# Patient Record
Sex: Female | Born: 1975 | Race: White | Hispanic: No | Smoking: Current every day smoker
Health system: Southern US, Community
[De-identification: ages and names within clinical notes are randomized; demographics above are authoritative.]

## PROBLEM LIST (undated history)

## (undated) DIAGNOSIS — S82201A Unspecified fracture of shaft of right tibia, initial encounter for closed fracture: Secondary | ICD-10-CM

## (undated) DIAGNOSIS — Z973 Presence of spectacles and contact lenses: Secondary | ICD-10-CM

## (undated) DIAGNOSIS — T8484XA Pain due to internal orthopedic prosthetic devices, implants and grafts, initial encounter: Secondary | ICD-10-CM

## (undated) DIAGNOSIS — S82401A Unspecified fracture of shaft of right fibula, initial encounter for closed fracture: Secondary | ICD-10-CM

## (undated) DIAGNOSIS — I10 Essential (primary) hypertension: Secondary | ICD-10-CM

## (undated) DIAGNOSIS — F419 Anxiety disorder, unspecified: Secondary | ICD-10-CM

## (undated) DIAGNOSIS — D649 Anemia, unspecified: Secondary | ICD-10-CM

## (undated) HISTORY — PX: FRACTURE SURGERY: SHX138

---

## 1995-01-29 HISTORY — PX: DILATION AND CURETTAGE OF UTERUS: SHX78

## 2006-04-26 ENCOUNTER — Emergency Department: Payer: Self-pay | Admitting: General Practice

## 2006-07-01 ENCOUNTER — Emergency Department (HOSPITAL_COMMUNITY): Admission: EM | Admit: 2006-07-01 | Discharge: 2006-07-01 | Payer: Self-pay | Admitting: Emergency Medicine

## 2007-01-30 ENCOUNTER — Emergency Department (HOSPITAL_COMMUNITY): Admission: EM | Admit: 2007-01-30 | Discharge: 2007-01-30 | Payer: Self-pay | Admitting: Emergency Medicine

## 2007-03-09 ENCOUNTER — Emergency Department (HOSPITAL_COMMUNITY): Admission: EM | Admit: 2007-03-09 | Discharge: 2007-03-09 | Payer: Self-pay | Admitting: Emergency Medicine

## 2007-04-10 ENCOUNTER — Ambulatory Visit: Payer: Self-pay | Admitting: Oncology

## 2007-04-16 LAB — CBC WITH DIFFERENTIAL (CANCER CENTER ONLY)
BASO#: 0 10*3/uL (ref 0.0–0.2)
BASO%: 0.4 % (ref 0.0–2.0)
Eosinophils Absolute: 0.1 10*3/uL (ref 0.0–0.5)
HGB: 11.1 g/dL — ABNORMAL LOW (ref 11.6–15.9)
LYMPH#: 1.6 10*3/uL (ref 0.9–3.3)
MCV: 73 fL — ABNORMAL LOW (ref 81–101)
MONO%: 5.1 % (ref 0.0–13.0)
NEUT#: 2.8 10*3/uL (ref 1.5–6.5)
Platelets: 275 10*3/uL (ref 145–400)
RDW: 15.1 % — ABNORMAL HIGH (ref 10.5–14.6)

## 2007-04-16 LAB — MORPHOLOGY - CHCC SATELLITE: PLT EST ~~LOC~~: ADEQUATE

## 2007-04-16 LAB — COMPREHENSIVE METABOLIC PANEL
Albumin: 4.7 g/dL (ref 3.5–5.2)
Alkaline Phosphatase: 71 U/L (ref 39–117)
BUN: 17 mg/dL (ref 6–23)
CO2: 23 mEq/L (ref 19–32)
Calcium: 8.9 mg/dL (ref 8.4–10.5)
Creatinine, Ser: 1.01 mg/dL (ref 0.40–1.20)
Sodium: 138 mEq/L (ref 135–145)

## 2007-04-16 LAB — IRON AND TIBC
%SAT: 8 % — ABNORMAL LOW (ref 20–55)
Iron: 35 ug/dL — ABNORMAL LOW (ref 42–145)
TIBC: 461 ug/dL (ref 250–470)

## 2007-04-16 LAB — VITAMIN B12: Vitamin B-12: 358 pg/mL (ref 211–911)

## 2007-04-16 LAB — FOLATE: Folate: 8.4 ng/mL

## 2007-04-23 ENCOUNTER — Emergency Department (HOSPITAL_COMMUNITY): Admission: EM | Admit: 2007-04-23 | Discharge: 2007-04-24 | Payer: Self-pay | Admitting: Emergency Medicine

## 2007-05-26 ENCOUNTER — Ambulatory Visit: Payer: Self-pay | Admitting: Oncology

## 2007-06-23 ENCOUNTER — Ambulatory Visit: Payer: Self-pay | Admitting: Internal Medicine

## 2007-06-25 LAB — CBC WITH DIFFERENTIAL (CANCER CENTER ONLY)
BASO#: 0 10*3/uL (ref 0.0–0.2)
EOS%: 3.9 % (ref 0.0–7.0)
HCT: 39.2 % (ref 34.8–46.6)
MCH: 29.2 pg (ref 26.0–34.0)
MCV: 85 fL (ref 81–101)
MONO#: 0.3 10*3/uL (ref 0.1–0.9)
NEUT#: 3.1 10*3/uL (ref 1.5–6.5)
Platelets: 272 10*3/uL (ref 145–400)
RDW: 18.4 % — ABNORMAL HIGH (ref 10.5–14.6)

## 2007-06-25 LAB — IRON AND TIBC
Iron: 46 ug/dL (ref 42–145)
UIBC: 228 ug/dL

## 2007-07-28 ENCOUNTER — Emergency Department (HOSPITAL_COMMUNITY): Admission: EM | Admit: 2007-07-28 | Discharge: 2007-07-28 | Payer: Self-pay | Admitting: Emergency Medicine

## 2007-08-07 ENCOUNTER — Emergency Department (HOSPITAL_COMMUNITY): Admission: EM | Admit: 2007-08-07 | Discharge: 2007-08-07 | Payer: Self-pay | Admitting: Emergency Medicine

## 2007-09-18 ENCOUNTER — Ambulatory Visit: Payer: Self-pay | Admitting: Oncology

## 2007-09-21 LAB — CBC WITH DIFFERENTIAL (CANCER CENTER ONLY)
BASO#: 0 10*3/uL (ref 0.0–0.2)
EOS%: 2 % (ref 0.0–7.0)
Eosinophils Absolute: 0.1 10*3/uL (ref 0.0–0.5)
HCT: 38.2 % (ref 34.8–46.6)
HGB: 13 g/dL (ref 11.6–15.9)
MCV: 92 fL (ref 81–101)
MONO%: 6 % (ref 0.0–13.0)
NEUT#: 2.4 10*3/uL (ref 1.5–6.5)
NEUT%: 50.2 % (ref 39.6–80.0)
RBC: 4.17 10*6/uL (ref 3.70–5.32)
WBC: 4.7 10*3/uL (ref 3.9–10.0)

## 2007-09-21 LAB — IRON AND TIBC
Iron: 140 ug/dL (ref 42–145)
TIBC: 346 ug/dL (ref 250–470)

## 2007-11-19 ENCOUNTER — Emergency Department (HOSPITAL_COMMUNITY): Admission: EM | Admit: 2007-11-19 | Discharge: 2007-11-19 | Payer: Self-pay | Admitting: Emergency Medicine

## 2007-12-17 ENCOUNTER — Ambulatory Visit: Payer: Self-pay | Admitting: Oncology

## 2007-12-19 ENCOUNTER — Emergency Department (HOSPITAL_COMMUNITY): Admission: EM | Admit: 2007-12-19 | Discharge: 2007-12-19 | Payer: Self-pay | Admitting: Emergency Medicine

## 2007-12-22 LAB — CBC WITH DIFFERENTIAL (CANCER CENTER ONLY)
BASO%: 0.5 % (ref 0.0–2.0)
EOS%: 4 % (ref 0.0–7.0)
HCT: 39.7 % (ref 34.8–46.6)
LYMPH%: 28.8 % (ref 14.0–48.0)
MCH: 30.9 pg (ref 26.0–34.0)
MCHC: 34.1 g/dL (ref 32.0–36.0)
MCV: 91 fL (ref 81–101)
MONO%: 5.2 % (ref 0.0–13.0)
RDW: 10.6 % (ref 10.5–14.6)
WBC: 6.3 10*3/uL (ref 3.9–10.0)

## 2007-12-22 LAB — FERRITIN: Ferritin: 194 ng/mL (ref 10–291)

## 2007-12-22 LAB — IRON AND TIBC: %SAT: 30 % (ref 20–55)

## 2008-01-25 ENCOUNTER — Emergency Department (HOSPITAL_COMMUNITY): Admission: EM | Admit: 2008-01-25 | Discharge: 2008-01-25 | Payer: Self-pay | Admitting: Emergency Medicine

## 2008-03-11 ENCOUNTER — Emergency Department (HOSPITAL_COMMUNITY): Admission: EM | Admit: 2008-03-11 | Discharge: 2008-03-11 | Payer: Self-pay | Admitting: Emergency Medicine

## 2008-04-07 ENCOUNTER — Emergency Department (HOSPITAL_COMMUNITY): Admission: EM | Admit: 2008-04-07 | Discharge: 2008-04-07 | Payer: Self-pay | Admitting: Emergency Medicine

## 2008-04-08 ENCOUNTER — Emergency Department (HOSPITAL_COMMUNITY): Admission: EM | Admit: 2008-04-08 | Discharge: 2008-04-08 | Payer: Self-pay | Admitting: Emergency Medicine

## 2008-04-14 ENCOUNTER — Ambulatory Visit: Payer: Self-pay | Admitting: Oncology

## 2008-04-18 LAB — IRON AND TIBC
%SAT: 42 % (ref 20–55)
Iron: 134 ug/dL (ref 42–145)

## 2008-04-18 LAB — CBC WITH DIFFERENTIAL (CANCER CENTER ONLY)
EOS%: 2.9 % (ref 0.0–7.0)
Eosinophils Absolute: 0.1 10*3/uL (ref 0.0–0.5)
HCT: 38.1 % (ref 34.8–46.6)
LYMPH%: 28.4 % (ref 14.0–48.0)
MCHC: 34.2 g/dL (ref 32.0–36.0)
MCV: 89 fL (ref 81–101)
Platelets: 233 10*3/uL (ref 145–400)
RDW: 11.4 % (ref 10.5–14.6)
WBC: 4.8 10*3/uL (ref 3.9–10.0)

## 2008-05-30 ENCOUNTER — Emergency Department (HOSPITAL_COMMUNITY): Admission: EM | Admit: 2008-05-30 | Discharge: 2008-05-30 | Payer: Self-pay | Admitting: Emergency Medicine

## 2008-06-17 ENCOUNTER — Emergency Department (HOSPITAL_COMMUNITY): Admission: EM | Admit: 2008-06-17 | Discharge: 2008-06-17 | Payer: Self-pay | Admitting: Emergency Medicine

## 2008-07-03 ENCOUNTER — Emergency Department (HOSPITAL_COMMUNITY): Admission: EM | Admit: 2008-07-03 | Discharge: 2008-07-03 | Payer: Self-pay | Admitting: Emergency Medicine

## 2008-07-15 ENCOUNTER — Emergency Department: Payer: Self-pay | Admitting: Emergency Medicine

## 2008-07-26 ENCOUNTER — Emergency Department (HOSPITAL_COMMUNITY): Admission: EM | Admit: 2008-07-26 | Discharge: 2008-07-26 | Payer: Self-pay | Admitting: Emergency Medicine

## 2008-09-06 ENCOUNTER — Emergency Department (HOSPITAL_COMMUNITY): Admission: EM | Admit: 2008-09-06 | Discharge: 2008-09-06 | Payer: Self-pay | Admitting: Emergency Medicine

## 2008-09-20 ENCOUNTER — Ambulatory Visit: Payer: Self-pay | Admitting: Internal Medicine

## 2008-12-19 ENCOUNTER — Emergency Department (HOSPITAL_COMMUNITY): Admission: EM | Admit: 2008-12-19 | Discharge: 2008-12-19 | Payer: Self-pay | Admitting: Emergency Medicine

## 2009-03-09 ENCOUNTER — Emergency Department (HOSPITAL_COMMUNITY): Admission: EM | Admit: 2009-03-09 | Discharge: 2009-03-09 | Payer: Self-pay | Admitting: Emergency Medicine

## 2009-04-12 ENCOUNTER — Ambulatory Visit: Payer: Self-pay | Admitting: Oncology

## 2009-04-23 ENCOUNTER — Emergency Department (HOSPITAL_COMMUNITY): Admission: EM | Admit: 2009-04-23 | Discharge: 2009-04-23 | Payer: Self-pay | Admitting: Emergency Medicine

## 2009-06-05 ENCOUNTER — Ambulatory Visit: Payer: Self-pay | Admitting: Oncology

## 2009-06-13 LAB — IRON AND TIBC
%SAT: 20 % (ref 20–55)
Iron: 63 ug/dL (ref 42–145)
TIBC: 320 ug/dL (ref 250–470)
UIBC: 257 ug/dL

## 2009-06-13 LAB — CBC WITH DIFFERENTIAL (CANCER CENTER ONLY)
LYMPH#: 1.8 10*3/uL (ref 0.9–3.3)
LYMPH%: 30.7 % (ref 14.0–48.0)
MCHC: 35.1 g/dL (ref 32.0–36.0)
MONO%: 5.5 % (ref 0.0–13.0)
NEUT#: 3.6 10*3/uL (ref 1.5–6.5)
Platelets: 256 10*3/uL (ref 145–400)
RBC: 4.67 10*6/uL (ref 3.70–5.32)
WBC: 6 10*3/uL (ref 3.9–10.0)

## 2009-06-13 LAB — CMP (CANCER CENTER ONLY)
AST: 28 U/L (ref 11–38)
CO2: 24 mEq/L (ref 18–33)
Calcium: 9.3 mg/dL (ref 8.0–10.3)
Chloride: 98 mEq/L (ref 98–108)
Creat: 0.7 mg/dl (ref 0.6–1.2)
Glucose, Bld: 109 mg/dL (ref 73–118)
Potassium: 4.5 mEq/L (ref 3.3–4.7)

## 2009-06-13 LAB — FERRITIN: Ferritin: 70 ng/mL (ref 10–291)

## 2009-06-16 ENCOUNTER — Emergency Department (HOSPITAL_COMMUNITY): Admission: EM | Admit: 2009-06-16 | Discharge: 2009-06-16 | Payer: Self-pay | Admitting: Emergency Medicine

## 2009-06-19 ENCOUNTER — Emergency Department: Payer: Self-pay | Admitting: Emergency Medicine

## 2009-06-27 ENCOUNTER — Emergency Department (HOSPITAL_COMMUNITY): Admission: EM | Admit: 2009-06-27 | Discharge: 2009-06-27 | Payer: Self-pay | Admitting: Emergency Medicine

## 2009-08-21 ENCOUNTER — Emergency Department (HOSPITAL_COMMUNITY): Admission: EM | Admit: 2009-08-21 | Discharge: 2009-08-21 | Payer: Self-pay | Admitting: Emergency Medicine

## 2009-09-02 ENCOUNTER — Emergency Department: Payer: Self-pay | Admitting: Internal Medicine

## 2009-10-03 ENCOUNTER — Emergency Department (HOSPITAL_COMMUNITY): Admission: EM | Admit: 2009-10-03 | Discharge: 2009-10-03 | Payer: Self-pay | Admitting: Emergency Medicine

## 2009-11-09 ENCOUNTER — Ambulatory Visit: Payer: Self-pay | Admitting: Internal Medicine

## 2010-01-30 ENCOUNTER — Emergency Department (HOSPITAL_COMMUNITY)
Admission: EM | Admit: 2010-01-30 | Discharge: 2010-01-30 | Payer: Self-pay | Source: Home / Self Care | Admitting: Emergency Medicine

## 2010-04-12 LAB — POCT PREGNANCY, URINE: Preg Test, Ur: NEGATIVE

## 2010-06-25 ENCOUNTER — Emergency Department (HOSPITAL_COMMUNITY)
Admission: EM | Admit: 2010-06-25 | Discharge: 2010-06-25 | Disposition: A | Discharge status: Home / Self Care | Payer: Medicaid Other | Attending: Emergency Medicine | Admitting: Emergency Medicine

## 2010-06-25 DIAGNOSIS — M5430 Sciatica, unspecified side: Secondary | ICD-10-CM | POA: Insufficient documentation

## 2010-06-25 DIAGNOSIS — I10 Essential (primary) hypertension: Secondary | ICD-10-CM | POA: Insufficient documentation

## 2010-06-25 DIAGNOSIS — M545 Low back pain, unspecified: Secondary | ICD-10-CM | POA: Insufficient documentation

## 2010-06-25 DIAGNOSIS — F988 Other specified behavioral and emotional disorders with onset usually occurring in childhood and adolescence: Secondary | ICD-10-CM | POA: Insufficient documentation

## 2010-06-25 DIAGNOSIS — IMO0002 Reserved for concepts with insufficient information to code with codable children: Secondary | ICD-10-CM | POA: Insufficient documentation

## 2010-08-21 ENCOUNTER — Ambulatory Visit (INDEPENDENT_AMBULATORY_CARE_PROVIDER_SITE_OTHER): Payer: Medicaid Other

## 2010-08-21 ENCOUNTER — Inpatient Hospital Stay (INDEPENDENT_AMBULATORY_CARE_PROVIDER_SITE_OTHER)
Admission: RE | Admit: 2010-08-21 | Discharge: 2010-08-21 | Disposition: A | Discharge status: Home / Self Care | Payer: Medicaid Other | Source: Ambulatory Visit | Attending: Emergency Medicine | Admitting: Emergency Medicine

## 2010-08-21 DIAGNOSIS — IMO0002 Reserved for concepts with insufficient information to code with codable children: Secondary | ICD-10-CM

## 2010-08-21 DIAGNOSIS — Z733 Stress, not elsewhere classified: Secondary | ICD-10-CM

## 2010-08-21 DIAGNOSIS — S0003XA Contusion of scalp, initial encounter: Secondary | ICD-10-CM

## 2010-08-21 DIAGNOSIS — S20219A Contusion of unspecified front wall of thorax, initial encounter: Secondary | ICD-10-CM

## 2010-08-21 DIAGNOSIS — S0083XA Contusion of other part of head, initial encounter: Secondary | ICD-10-CM

## 2011-02-02 ENCOUNTER — Emergency Department (HOSPITAL_COMMUNITY)
Admission: EM | Admit: 2011-02-02 | Discharge: 2011-02-03 | Disposition: A | Discharge status: Home / Self Care | Payer: Medicaid Other | Attending: Emergency Medicine | Admitting: Emergency Medicine

## 2011-02-02 ENCOUNTER — Encounter: Payer: Self-pay | Admitting: *Deleted

## 2011-02-02 DIAGNOSIS — S0093XA Contusion of unspecified part of head, initial encounter: Secondary | ICD-10-CM

## 2011-02-02 DIAGNOSIS — M542 Cervicalgia: Secondary | ICD-10-CM | POA: Insufficient documentation

## 2011-02-02 DIAGNOSIS — Z79899 Other long term (current) drug therapy: Secondary | ICD-10-CM | POA: Insufficient documentation

## 2011-02-02 DIAGNOSIS — R220 Localized swelling, mass and lump, head: Secondary | ICD-10-CM | POA: Insufficient documentation

## 2011-02-02 DIAGNOSIS — S0990XA Unspecified injury of head, initial encounter: Secondary | ICD-10-CM | POA: Insufficient documentation

## 2011-02-02 DIAGNOSIS — S40029A Contusion of unspecified upper arm, initial encounter: Secondary | ICD-10-CM | POA: Insufficient documentation

## 2011-02-02 DIAGNOSIS — R51 Headache: Secondary | ICD-10-CM | POA: Insufficient documentation

## 2011-02-02 DIAGNOSIS — I10 Essential (primary) hypertension: Secondary | ICD-10-CM | POA: Insufficient documentation

## 2011-02-02 DIAGNOSIS — S0003XA Contusion of scalp, initial encounter: Secondary | ICD-10-CM | POA: Insufficient documentation

## 2011-02-02 HISTORY — DX: Essential (primary) hypertension: I10

## 2011-02-02 HISTORY — DX: Anemia, unspecified: D64.9
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
## 2011-02-03 ENCOUNTER — Emergency Department (HOSPITAL_COMMUNITY): Payer: Medicaid Other

## 2011-02-03 ENCOUNTER — Encounter (HOSPITAL_COMMUNITY): Payer: Self-pay

## 2011-02-03 MED ORDER — DIPHENHYDRAMINE HCL 50 MG/ML IJ SOLN
50.0000 mg | Freq: Once | INTRAMUSCULAR | Status: AC
  Administered 2011-02-03: 50 mg via INTRAVENOUS
  Filled 2011-02-03: qty 1

## 2011-02-03 MED ORDER — SODIUM CHLORIDE 0.9 % IV SOLN
INTRAVENOUS | Status: DC
  Administered 2011-02-03: 02:00:00 via INTRAVENOUS

## 2011-02-03 MED ORDER — CYCLOBENZAPRINE HCL 10 MG PO TABS: 10.0000 mg | ORAL_TABLET | Freq: Three times a day (TID) | ORAL | Status: AC | PRN

## 2011-02-03 MED ORDER — METOCLOPRAMIDE HCL 5 MG/ML IJ SOLN
10.0000 mg | Freq: Once | INTRAMUSCULAR | Status: AC
  Administered 2011-02-03: 10 mg via INTRAVENOUS
  Filled 2011-02-03: qty 2

## 2011-02-03 MED ORDER — TRAMADOL-ACETAMINOPHEN 37.5-325 MG PO TABS: ORAL_TABLET | ORAL | Status: AC

## 2011-02-03 NOTE — ED Notes (Signed)
[employer] at bedside to speak with pt.  
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
## 2011-05-07 ENCOUNTER — Emergency Department (HOSPITAL_COMMUNITY)
Admission: EM | Admit: 2011-05-07 | Discharge: 2011-05-08 | Disposition: A | Discharge status: Home / Self Care | Payer: Medicaid Other | Attending: Emergency Medicine | Admitting: Emergency Medicine

## 2011-05-07 ENCOUNTER — Emergency Department (HOSPITAL_COMMUNITY)
Admission: EM | Admit: 2011-05-07 | Discharge: 2011-05-07 | Disposition: A | Discharge status: Home / Self Care | Payer: Medicaid Other | Source: Home / Self Care | Attending: Emergency Medicine | Admitting: Emergency Medicine

## 2011-05-07 ENCOUNTER — Encounter (HOSPITAL_COMMUNITY): Payer: Self-pay | Admitting: Emergency Medicine

## 2011-05-07 DIAGNOSIS — I10 Essential (primary) hypertension: Secondary | ICD-10-CM | POA: Insufficient documentation

## 2011-05-07 DIAGNOSIS — R1110 Vomiting, unspecified: Secondary | ICD-10-CM

## 2011-05-07 DIAGNOSIS — R109 Unspecified abdominal pain: Secondary | ICD-10-CM | POA: Insufficient documentation

## 2011-05-07 DIAGNOSIS — R112 Nausea with vomiting, unspecified: Secondary | ICD-10-CM | POA: Insufficient documentation

## 2011-05-07 DIAGNOSIS — E860 Dehydration: Secondary | ICD-10-CM

## 2011-05-07 DIAGNOSIS — K2970 Gastritis, unspecified, without bleeding: Secondary | ICD-10-CM | POA: Insufficient documentation

## 2011-05-07 DIAGNOSIS — F17200 Nicotine dependence, unspecified, uncomplicated: Secondary | ICD-10-CM | POA: Insufficient documentation

## 2011-05-07 LAB — DIFFERENTIAL
Eosinophils Relative: 0 % (ref 0–5)
Lymphocytes Relative: 11 % — ABNORMAL LOW (ref 12–46)
Lymphs Abs: 1 10*3/uL (ref 0.7–4.0)
Monocytes Absolute: 0.3 10*3/uL (ref 0.1–1.0)

## 2011-05-07 LAB — BASIC METABOLIC PANEL
CO2: 24 mEq/L (ref 19–32)
Chloride: 102 mEq/L (ref 96–112)
Creatinine, Ser: 0.9 mg/dL (ref 0.50–1.10)
Glucose, Bld: 124 mg/dL — ABNORMAL HIGH (ref 70–99)
Sodium: 138 mEq/L (ref 135–145)

## 2011-05-07 LAB — CBC
HCT: 43.1 % (ref 36.0–46.0)
MCH: 32 pg (ref 26.0–34.0)
MCV: 91.3 fL (ref 78.0–100.0)
RBC: 4.72 MIL/uL (ref 3.87–5.11)
WBC: 8.8 10*3/uL (ref 4.0–10.5)

## 2011-05-07 LAB — URINALYSIS, ROUTINE W REFLEX MICROSCOPIC
Glucose, UA: NEGATIVE mg/dL
Ketones, ur: >80 mg/dL — AB
Leukocytes, UA: NEGATIVE
pH: 7 (ref 5.0–8.0)

## 2011-05-07 LAB — URINE MICROSCOPIC-ADD ON

## 2011-05-07 MED ORDER — GLYCOPYRROLATE 0.2 MG/ML IJ SOLN
0.2000 mg | Freq: Once | INTRAMUSCULAR | Status: AC
  Administered 2011-05-07: 0.2 mg via INTRAVENOUS
  Filled 2011-05-07: qty 1

## 2011-05-07 MED ORDER — ONDANSETRON HCL 4 MG/2ML IJ SOLN
4.0000 mg | Freq: Once | INTRAMUSCULAR | Status: AC
  Administered 2011-05-07: 4 mg via INTRAVENOUS
  Filled 2011-05-07: qty 2

## 2011-05-07 MED ORDER — SODIUM CHLORIDE 0.9 % IV BOLUS (SEPSIS)
1000.0000 mL | Freq: Once | INTRAVENOUS | Status: AC
  Administered 2011-05-07: 1000 mL via INTRAVENOUS

## 2011-05-07 MED ORDER — METOCLOPRAMIDE HCL 10 MG PO TABS: 10.0000 mg | ORAL_TABLET | Freq: Four times a day (QID) | ORAL | Status: DC

## 2011-05-07 MED ORDER — DIPHENOXYLATE-ATROPINE 2.5-0.025 MG PO TABS
1.0000 | ORAL_TABLET | Freq: Once | ORAL | Status: AC
  Administered 2011-05-07: 1 via ORAL
  Filled 2011-05-07: qty 1

## 2011-05-07 MED ORDER — SODIUM CHLORIDE 0.9 % IV SOLN: INTRAVENOUS | Status: DC

## 2011-05-07 MED ORDER — DIPHENOXYLATE-ATROPINE 2.5-0.025 MG PO TABS: 1.0000 | ORAL_TABLET | Freq: Four times a day (QID) | ORAL | Status: AC | PRN

## 2011-05-07 NOTE — ED Notes (Signed)
Pt reports, "I have been throwing up since yesterday afternoon and I can't keep anything down." "I also feel lightheaded." Pt denies change in diet.

## 2011-05-07 NOTE — Discharge Instructions (Signed)
Use Lomotil for abdominal cramps, and Reglan for nausea and vomiting.  Followup with your Dr. if your symptoms.  Last more than 2-3 days.  Return for worse or uncontrolled symptoms

## 2011-05-07 NOTE — ED Notes (Signed)
Pt states she has had abd pain with nausea and vomiting since last night about 8pm  Pt states this evening she has started having some numbness in her left arm

## 2011-05-07 NOTE — ED Provider Notes (Addendum)
History     CSN: [id number]  Arrival date & time 05/07/11  2029   First MD Initiated Contact with Patient 05/07/11 2224      Chief Complaint  Patient presents with  . Abdominal Pain  . Emesis    (Consider location/radiation/quality/duration/timing/severity/associated sxs/prior treatment) Patient is a 36 y.o. female presenting with abdominal pain and vomiting. The history is provided by the patient.  Abdominal Pain The primary symptoms of the illness include abdominal pain, nausea and vomiting. The primary symptoms of the illness do not include fever, shortness of breath, diarrhea or dysuria.  Symptoms associated with the illness do not include chills or hematuria.  Emesis  Associated symptoms include abdominal pain. Pertinent negatives include no chills, no cough, no diarrhea, no fever and no headaches.   the patient is a 35 year old, female, who complains of nausea and vomiting, and abdominal cramps since yesterday.  She denies diarrhea.  She denies urinary tract symptoms.  She rarely drinks alcohol.  She has never had abdominal surgery in the past.  She does not have a history of peptic ulcer disease.  Past Medical History  Diagnosis Date  . Hypertension   . Anemia   . Anemia     Past Surgical History  Procedure Date  . Birth control implant in l arm     Family History  Problem Relation Age of Onset  . Diabetes Mother   . Hypertension Mother   . Heart failure Mother     History  Substance Use Topics  . Smoking status: Current Everyday Smoker -- 0.5 packs/day for 24 years    Types: Cigarettes  . Smokeless tobacco: Not on file  . Alcohol Use: 1.8 oz/week    1 Cans of beer, 2 Shots of liquor per week     rare    OB History    Grav Para Term Preterm Abortions TAB SAB Ect Mult Living                  Review of Systems  Constitutional: Negative for fever and chills.  Respiratory: Negative for cough and shortness of breath.   Cardiovascular: Negative for chest  pain.  Gastrointestinal: Positive for nausea, vomiting and abdominal pain. Negative for diarrhea.  Genitourinary: Negative for dysuria and hematuria.  Neurological: Negative for headaches.  Psychiatric/Behavioral: Negative for confusion.  All other systems reviewed and are negative.    Allergies  Review of patient's allergies indicates no known allergies.  Home Medications   Current Outpatient Rx  Name Route Sig Dispense Refill  . AMLODIPINE BESYLATE 10 MG PO TABS Oral Take 10 mg by mouth daily.    . ETONOGESTREL 68 MG [place] IMPL Subcutaneous Inject 1 each into the skin once.      BP 171/105  Pulse 71  Temp(Src) 97.6 F (36.4 C) (Oral)  Resp 20  SpO2 100%  LMP 04/23/2011  Physical Exam  Vitals reviewed. Constitutional: She is oriented to person, place, and time. She appears well-developed and well-nourished.  HENT:  Head: Normocephalic and atraumatic.  Eyes: Conjunctivae and EOM are normal.  Neck: Normal range of motion. Neck supple.  Cardiovascular: Normal rate.   No murmur heard. Pulmonary/Chest: Effort normal. No respiratory distress.  Abdominal: Soft. She exhibits no distension. There is tenderness. There is no rebound and no guarding.       Mild diffuse tenderness, with no peritoneal sign  Musculoskeletal: Normal range of motion.  Neurological: She is alert and oriented to person, place, and time.  Skin: Skin is warm and dry.  Psychiatric: She has a normal mood and affect. Thought content normal.    ED Course  Procedures (including critical care time) 36 year old, female, with no significant past medical history presents emergency department complaining of abdominal cramping with nausea and vomiting.  Since yesterday.  No diarrhea.  Only rare alcohol use.  No acute abdomen.  We will establish IV treat her symptoms and perform laboratory testing, to see if there is any evidence of dehydration from persistent vomiting, that she report  Labs Reviewed  URINALYSIS,  ROUTINE W REFLEX MICROSCOPIC - Abnormal; Notable for the following:    APPearance CLOUDY (*)    Hgb urine dipstick LARGE (*)    Ketones, ur >80 (*)    Protein, ur 100 (*)    All other components within normal limits  URINE MICROSCOPIC-ADD ON - Abnormal; Notable for the following:    Squamous Epithelial / LPF FEW (*)    All other components within normal limits  BASIC METABOLIC PANEL  CBC  DIFFERENTIAL   No results found.   No diagnosis found.  11:42 PM Still says she is still in pain and nauseated  Patient states that she vomited up the Lomotil.  We will give her another Lomotil  MDM  Gastritis with n/v No acute abdomen Ketonuria         Cheri Guppy, MD 05/07/11 [id number]  Cheri Guppy, MD 05/07/11 [id number]  Cheri Guppy, MD 05/08/11 [id number]

## 2011-05-07 NOTE — ED Notes (Signed)
No answer x3

## 2011-05-08 MED ORDER — PROMETHAZINE HCL 25 MG RE SUPP: 25.0000 mg | Freq: Four times a day (QID) | RECTAL | Status: DC | PRN

## 2011-05-08 MED ORDER — DIPHENOXYLATE-ATROPINE 2.5-0.025 MG PO TABS
1.0000 | ORAL_TABLET | Freq: Once | ORAL | Status: DC
  Filled 2011-05-08: qty 1

## 2011-05-08 MED ORDER — PROMETHAZINE HCL 25 MG PO TABS: 25.0000 mg | ORAL_TABLET | Freq: Four times a day (QID) | ORAL | Status: DC | PRN

## 2011-05-08 MED ORDER — SODIUM CHLORIDE 0.9 % IV BOLUS (SEPSIS)
1000.0000 mL | Freq: Once | INTRAVENOUS | Status: AC
  Administered 2011-05-08: 1000 mL via INTRAVENOUS

## 2011-05-08 MED ORDER — PROMETHAZINE HCL 25 MG/ML IJ SOLN
12.5000 mg | Freq: Once | INTRAMUSCULAR | Status: AC
  Administered 2011-05-08: 12.5 mg via INTRAVENOUS
  Filled 2011-05-08: qty 1

## 2011-07-28 ENCOUNTER — Inpatient Hospital Stay (HOSPITAL_COMMUNITY)
Admission: EM | Admit: 2011-07-28 | Discharge: 2011-07-30 | DRG: 494 | Disposition: A | Discharge status: Home Health | Payer: Medicaid Other | Attending: Orthopedic Surgery | Admitting: Orthopedic Surgery

## 2011-07-28 ENCOUNTER — Inpatient Hospital Stay (HOSPITAL_COMMUNITY): Payer: Medicaid Other

## 2011-07-28 ENCOUNTER — Encounter (HOSPITAL_COMMUNITY): Payer: Self-pay | Admitting: Orthopedic Surgery

## 2011-07-28 ENCOUNTER — Encounter (HOSPITAL_COMMUNITY): Payer: Self-pay | Admitting: Anesthesiology

## 2011-07-28 ENCOUNTER — Emergency Department (HOSPITAL_COMMUNITY): Payer: Medicaid Other

## 2011-07-28 ENCOUNTER — Encounter (HOSPITAL_COMMUNITY)
Admission: EM | Disposition: A | Discharge status: Home Health | Payer: Self-pay | Source: Home / Self Care | Attending: Orthopedic Surgery

## 2011-07-28 ENCOUNTER — Encounter (HOSPITAL_COMMUNITY): Payer: Self-pay | Admitting: Emergency Medicine

## 2011-07-28 ENCOUNTER — Inpatient Hospital Stay (HOSPITAL_COMMUNITY): Payer: Medicaid Other | Admitting: Anesthesiology

## 2011-07-28 DIAGNOSIS — I10 Essential (primary) hypertension: Secondary | ICD-10-CM | POA: Diagnosis present

## 2011-07-28 DIAGNOSIS — S82209A Unspecified fracture of shaft of unspecified tibia, initial encounter for closed fracture: Principal | ICD-10-CM | POA: Diagnosis present

## 2011-07-28 DIAGNOSIS — X58XXXA Exposure to other specified factors, initial encounter: Secondary | ICD-10-CM | POA: Diagnosis present

## 2011-07-28 DIAGNOSIS — Y929 Unspecified place or not applicable: Secondary | ICD-10-CM

## 2011-07-28 DIAGNOSIS — F17200 Nicotine dependence, unspecified, uncomplicated: Secondary | ICD-10-CM | POA: Diagnosis present

## 2011-07-28 DIAGNOSIS — S82201A Unspecified fracture of shaft of right tibia, initial encounter for closed fracture: Secondary | ICD-10-CM | POA: Diagnosis present

## 2011-07-28 DIAGNOSIS — S8291XA Unspecified fracture of right lower leg, initial encounter for closed fracture: Secondary | ICD-10-CM

## 2011-07-28 HISTORY — DX: Unspecified fracture of shaft of right fibula, initial encounter for closed fracture: S82.401A

## 2011-07-28 HISTORY — PX: TIBIA IM NAIL INSERTION: SHX2516

## 2011-07-28 HISTORY — DX: Unspecified fracture of shaft of right tibia, initial encounter for closed fracture: S82.201A

## 2011-07-28 LAB — URINALYSIS, ROUTINE W REFLEX MICROSCOPIC
Bilirubin Urine: NEGATIVE
Nitrite: NEGATIVE
Protein, ur: NEGATIVE mg/dL
Specific Gravity, Urine: 1.027 (ref 1.005–1.030)
Urobilinogen, UA: 0.2 mg/dL (ref 0.0–1.0)

## 2011-07-28 LAB — URINE MICROSCOPIC-ADD ON

## 2011-07-28 LAB — CBC
Hemoglobin: 14.4 g/dL (ref 12.0–15.0)
RBC: 4.44 MIL/uL (ref 3.87–5.11)
WBC: 11 10*3/uL — ABNORMAL HIGH (ref 4.0–10.5)

## 2011-07-28 SURGERY — INSERTION, INTRAMEDULLARY ROD, TIBIA
Anesthesia: General | Site: Leg Lower | Laterality: Right | Wound class: Clean

## 2011-07-28 MED ORDER — FENTANYL CITRATE 0.05 MG/ML IJ SOLN
INTRAMUSCULAR | Status: DC | PRN
  Administered 2011-07-28: 100 ug via INTRAVENOUS
  Administered 2011-07-28 (×2): 50 ug via INTRAVENOUS
  Administered 2011-07-28: 100 ug via INTRAVENOUS
  Administered 2011-07-28: 50 ug via INTRAVENOUS
  Administered 2011-07-28: 100 ug via INTRAVENOUS
  Administered 2011-07-28: 50 ug via INTRAVENOUS
  Administered 2011-07-28: 100 ug via INTRAVENOUS
  Administered 2011-07-28: 50 ug via INTRAVENOUS
  Administered 2011-07-28: 100 ug via INTRAVENOUS

## 2011-07-28 MED ORDER — OXYCODONE-ACETAMINOPHEN 10-325 MG PO TABS: 1.0000 | ORAL_TABLET | Freq: Four times a day (QID) | ORAL | Status: AC | PRN

## 2011-07-28 MED ORDER — METHOCARBAMOL 500 MG PO TABS: 500.0000 mg | ORAL_TABLET | Freq: Four times a day (QID) | ORAL | Status: AC

## 2011-07-28 MED ORDER — POTASSIUM CHLORIDE IN NACL 20-0.9 MEQ/L-% IV SOLN
INTRAVENOUS | Status: DC
  Filled 2011-07-28 (×3): qty 1000

## 2011-07-28 MED ORDER — HYDROMORPHONE HCL PF 1 MG/ML IJ SOLN
INTRAMUSCULAR | Status: AC
  Administered 2011-07-28: 1 mg via INTRAVENOUS
  Filled 2011-07-28: qty 1

## 2011-07-28 MED ORDER — HYDROMORPHONE HCL PF 1 MG/ML IJ SOLN
0.5000 mg | INTRAMUSCULAR | Status: DC | PRN
  Administered 2011-07-28 – 2011-07-29 (×3): 0.5 mg via INTRAVENOUS
  Filled 2011-07-28 (×2): qty 1

## 2011-07-28 MED ORDER — MORPHINE SULFATE 4 MG/ML IJ SOLN
6.0000 mg | Freq: Once | INTRAMUSCULAR | Status: AC
  Administered 2011-07-28: 6 mg via INTRAVENOUS
  Filled 2011-07-28: qty 2

## 2011-07-28 MED ORDER — ONDANSETRON HCL 4 MG/2ML IJ SOLN
INTRAMUSCULAR | Status: DC | PRN
  Administered 2011-07-28: 4 mg via INTRAVENOUS

## 2011-07-28 MED ORDER — HYDROMORPHONE HCL PF 1 MG/ML IJ SOLN
1.0000 mg | Freq: Once | INTRAMUSCULAR | Status: AC
  Administered 2011-07-28: 1 mg via INTRAVENOUS

## 2011-07-28 MED ORDER — LACTATED RINGERS IV SOLN
INTRAVENOUS | Status: DC | PRN
  Administered 2011-07-28 (×2): via INTRAVENOUS

## 2011-07-28 MED ORDER — MIDAZOLAM HCL 5 MG/5ML IJ SOLN
INTRAMUSCULAR | Status: DC | PRN
  Administered 2011-07-28: 2 mg via INTRAVENOUS

## 2011-07-28 MED ORDER — CEFAZOLIN SODIUM 1-5 GM-% IV SOLN
INTRAVENOUS | Status: DC | PRN
  Administered 2011-07-28: 3 g via INTRAVENOUS

## 2011-07-28 MED ORDER — OXYCODONE HCL 5 MG PO TABS
5.0000 mg | ORAL_TABLET | ORAL | Status: DC | PRN
  Administered 2011-07-28 – 2011-07-29 (×2): 10 mg via ORAL
  Filled 2011-07-28 (×3): qty 2

## 2011-07-28 MED ORDER — ENOXAPARIN SODIUM 40 MG/0.4ML ~~LOC~~ SOLN: 40.0000 mg | SUBCUTANEOUS | Status: DC

## 2011-07-28 MED ORDER — SUCCINYLCHOLINE CHLORIDE 20 MG/ML IJ SOLN
INTRAMUSCULAR | Status: DC | PRN
  Administered 2011-07-28: 120 mg via INTRAVENOUS

## 2011-07-28 MED ORDER — HYDROMORPHONE HCL PF 1 MG/ML IJ SOLN
INTRAMUSCULAR | Status: AC
  Filled 2011-07-28: qty 1

## 2011-07-28 MED ORDER — DEXTROSE 5 % IV SOLN
INTRAVENOUS | Status: DC | PRN
  Administered 2011-07-28: 17:00:00 via INTRAVENOUS

## 2011-07-28 MED ORDER — POTASSIUM CHLORIDE IN NACL 20-0.45 MEQ/L-% IV SOLN
INTRAVENOUS | Status: DC
  Administered 2011-07-28 – 2011-07-30 (×3): via INTRAVENOUS
  Filled 2011-07-28 (×4): qty 1000

## 2011-07-28 MED ORDER — 0.9 % SODIUM CHLORIDE (POUR BTL) OPTIME
TOPICAL | Status: DC | PRN
  Administered 2011-07-28: 500 mL

## 2011-07-28 MED ORDER — PROPOFOL 10 MG/ML IV EMUL
INTRAVENOUS | Status: DC | PRN
  Administered 2011-07-28: 200 mg via INTRAVENOUS

## 2011-07-28 MED ORDER — LIDOCAINE HCL (CARDIAC) 20 MG/ML IV SOLN
INTRAVENOUS | Status: DC | PRN
  Administered 2011-07-28: 70 mg via INTRAVENOUS

## 2011-07-28 MED ORDER — SENNA-DOCUSATE SODIUM 8.6-50 MG PO TABS: 1.0000 | ORAL_TABLET | Freq: Every day | ORAL | Status: DC

## 2011-07-28 MED ORDER — HYDROMORPHONE HCL PF 1 MG/ML IJ SOLN
0.2500 mg | INTRAMUSCULAR | Status: DC | PRN
  Administered 2011-07-28 (×4): 0.5 mg via INTRAVENOUS
  Filled 2011-07-28 (×2): qty 1

## 2011-07-28 MED ORDER — ACETAMINOPHEN 10 MG/ML IV SOLN
INTRAVENOUS | Status: DC | PRN
  Administered 2011-07-28: 1000 mg via INTRAVENOUS

## 2011-07-28 SURGICAL SUPPLY — 70 items
APL SKNCLS STERI-STRIP NONHPOA (GAUZE/BANDAGES/DRESSINGS) ×1
BANDAGE ELASTIC 4 VELCRO ST LF (GAUZE/BANDAGES/DRESSINGS) ×3 IMPLANT
BANDAGE ELASTIC 6 VELCRO ST LF (GAUZE/BANDAGES/DRESSINGS) ×1 IMPLANT
BANDAGE ESMARK 6X9 LF (GAUZE/BANDAGES/DRESSINGS) IMPLANT
BANDAGE GAUZE ELAST BULKY 4 IN (GAUZE/BANDAGES/DRESSINGS) ×1 IMPLANT
BENZOIN TINCTURE PRP APPL 2/3 (GAUZE/BANDAGES/DRESSINGS) ×1 IMPLANT
BIT DRILL 3.8X6 NS (BIT) ×1 IMPLANT
BIT DRILL 4.4 NS (BIT) ×1 IMPLANT
BLADE SURG 15 STRL LF DISP TIS (BLADE) ×1 IMPLANT
BLADE SURG 15 STRL SS (BLADE)
BLADE SURG ROTATE 9660 (MISCELLANEOUS) IMPLANT
BNDG CMPR 9X6 STRL LF SNTH (GAUZE/BANDAGES/DRESSINGS) ×1
BNDG COHESIVE 6X5 TAN STRL LF (GAUZE/BANDAGES/DRESSINGS) ×2 IMPLANT
BNDG ESMARK 6X9 LF (GAUZE/BANDAGES/DRESSINGS) ×2
BOOTCOVER CLEANROOM LRG (PROTECTIVE WEAR) ×2 IMPLANT
CLOTH BEACON ORANGE TIMEOUT ST (SAFETY) ×2 IMPLANT
COVER SURGICAL LIGHT HANDLE (MISCELLANEOUS) ×3 IMPLANT
CUFF TOURNIQUET SINGLE 34IN LL (TOURNIQUET CUFF) ×1 IMPLANT
CUFF TOURNIQUET SINGLE 44IN (TOURNIQUET CUFF) IMPLANT
DRAPE C-ARM 42X72 X-RAY (DRAPES) ×2 IMPLANT
DRAPE C-ARMOR (DRAPES) ×2 IMPLANT
DRAPE ORTHO SPLIT 77X108 STRL (DRAPES) ×6
DRAPE PROXIMA HALF (DRAPES) ×4 IMPLANT
DRAPE SURG ORHT 6 SPLT 77X108 (DRAPES) ×2 IMPLANT
DRAPE U-SHAPE 47X51 STRL (DRAPES) ×2 IMPLANT
DRSG ADAPTIC 3X8 NADH LF (GAUZE/BANDAGES/DRESSINGS) ×1 IMPLANT
DRSG EMULSION OIL 3X3 NADH (GAUZE/BANDAGES/DRESSINGS) ×1 IMPLANT
DRSG PAD ABDOMINAL 8X10 ST (GAUZE/BANDAGES/DRESSINGS) ×1 IMPLANT
DURAPREP 26ML APPLICATOR (WOUND CARE) ×4 IMPLANT
ELECT REM PT RETURN 9FT ADLT (ELECTROSURGICAL) ×2
ELECTRODE REM PT RTRN 9FT ADLT (ELECTROSURGICAL) ×1 IMPLANT
FACESHIELD LNG OPTICON STERILE (SAFETY) IMPLANT
GLOVE BIO SURGEON STRL SZ7.5 (GLOVE) ×1 IMPLANT
GLOVE BIOGEL PI IND STRL 8 (GLOVE) ×1 IMPLANT
GLOVE BIOGEL PI INDICATOR 8 (GLOVE) ×4
GLOVE NEODERM STER SZ 7 (GLOVE) ×1 IMPLANT
GLOVE ORTHO TXT STRL SZ7.5 (GLOVE) ×3 IMPLANT
GLOVE SURG ORTHO 8.0 STRL STRW (GLOVE) ×4 IMPLANT
GOWN STRL NON-REIN LRG LVL3 (GOWN DISPOSABLE) ×1 IMPLANT
GUIDEWIRE BALL NOSE 80CM (WIRE) ×1 IMPLANT
KIT BASIN OR (CUSTOM PROCEDURE TRAY) ×2 IMPLANT
KIT ROOM TURNOVER OR (KITS) ×2 IMPLANT
MANIFOLD NEPTUNE II (INSTRUMENTS) ×1 IMPLANT
NAIL IM TIBIAL 10X34.5 (Orthopedic Implant) IMPLANT
NS IRRIG 1000ML POUR BTL (IV SOLUTION) ×2 IMPLANT
PACK GENERAL/GYN (CUSTOM PROCEDURE TRAY) ×2 IMPLANT
PAD ARMBOARD 7.5X6 YLW CONV (MISCELLANEOUS) ×3 IMPLANT
PAD CAST 4YDX4 CTTN HI CHSV (CAST SUPPLIES) IMPLANT
PADDING CAST COTTON 4X4 STRL (CAST SUPPLIES) ×4
PIN GUIDE ACE (PIN) ×1 IMPLANT
SCREW ACECAP 38MM (Screw) ×1 IMPLANT
SCREW ACECAP 46MM (Screw) ×1 IMPLANT
SCREW ACECAP 50MM (Screw) ×1 IMPLANT
SCREW PROXIMAL DEPUY (Screw) ×2 IMPLANT
SCREW PRXML FT 40X5.5XNS LF (Screw) IMPLANT
SPONGE GAUZE 4X4 12PLY (GAUZE/BANDAGES/DRESSINGS) ×3 IMPLANT
STAPLER VISISTAT 35W (STAPLE) ×1 IMPLANT
STOCKINETTE IMPERVIOUS LG (DRAPES) ×2 IMPLANT
STRIP CLOSURE SKIN 1/2X4 (GAUZE/BANDAGES/DRESSINGS) ×1 IMPLANT
SUT MNCRL AB 4-0 PS2 18 (SUTURE) ×2 IMPLANT
SUT VIC AB 0 CT1 18XCR BRD 8 (SUTURE) ×1 IMPLANT
SUT VIC AB 0 CT1 8-18 (SUTURE) ×2
SUT VIC AB 2-0 CT1 27 (SUTURE) ×2
SUT VIC AB 2-0 CT1 TAPERPNT 27 (SUTURE) ×1 IMPLANT
SUT VIC AB 3-0 SH 8-18 (SUTURE) ×2 IMPLANT
TIBIAL NAIL 10X34.5 (Orthopedic Implant) ×2 IMPLANT
TOWEL OR 17X24 6PK STRL BLUE (TOWEL DISPOSABLE) ×2 IMPLANT
TOWEL OR 17X26 10 PK STRL BLUE (TOWEL DISPOSABLE) ×2 IMPLANT
TRAY FOLEY CATH 14FR (SET/KITS/TRAYS/PACK) IMPLANT
WATER STERILE IRR 1000ML POUR (IV SOLUTION) ×1 IMPLANT

## 2011-07-28 NOTE — Transfer of Care (Signed)
Immediate Anesthesia Transfer of Care Note  Patient: Bonnie Kelly  Procedure(s) Performed: Procedure(s) (LRB): INTRAMEDULLARY (IM) NAIL TIBIAL (Right)  Patient Location: PACU  Anesthesia Type: General  Level of Consciousness: awake, alert  and oriented  Airway & Oxygen Therapy: Patient Spontanous Breathing and Patient connected to nasal cannula oxygen  Post-op Assessment: Report given to PACU RN and Post -op Vital signs reviewed and stable  Post vital signs: Reviewed and stable  Complications: No apparent anesthesia complications

## 2011-07-28 NOTE — Anesthesia Postprocedure Evaluation (Signed)
  Anesthesia Post-op Note  Patient: Bonnie Kelly  Procedure(s) Performed: Procedure(s) (LRB): INTRAMEDULLARY (IM) NAIL TIBIAL (Right)  Patient Location: PACU  Anesthesia Type: General  Level of Consciousness: awake  Airway and Oxygen Therapy: Patient Spontanous Breathing  Post-op Pain: mild  Post-op Assessment: Post-op Vital signs reviewed  Post-op Vital Signs: Reviewed  Complications: No apparent anesthesia complications

## 2011-07-28 NOTE — Op Note (Signed)
07/28/2011  7:32 PM  PATIENT:  Bonnie Kelly    PRE-OPERATIVE DIAGNOSIS:  Right tibia fracture  POST-OPERATIVE DIAGNOSIS:  Same  PROCEDURE:  INTRAMEDULLARY (IM) NAIL TIBIAL  SURGEON:  Eulas Post, MD  PHYSICIAN ASSISTANT: Janace Litten, OPA-C, present and scrubbed throughout the case, critical for completion in a timely fashion, and for retraction, instrumentation, and closure.  ANESTHESIA:   General  PREOPERATIVE INDICATIONS:  Bonnie Kelly is a  36 y.o. female with a diagnosis of Right tibia fracture who elected for surgical management.    The risks benefits and alternatives were discussed with the patient preoperatively including but not limited to the risks of infection, bleeding, nerve injury, cardiopulmonary complications, the need for revision surgery, among others, and the patient was willing to proceed. We also discussed the risks of knee pain, blood clots, stiffness, the need for removal of hardware, malunion, nonunion, hardware prominence, hardware failure, and others.  OPERATIVE IMPLANTS: Biomet size 10 x 34.5 cm nail with one proximal interlocking screw and 2 distal interlocking screws.  OPERATIVE FINDINGS: Moderate osteopenia.  OPERATIVE PROCEDURE: The patient is brought to the operating room placed in supine position. General anesthesia was administered. IV antibiotics were given. Time out was performed. The right lower extremity was prepped and draped in usual sterile fashion. The leg was elevated and exsanguinated and the tourniquet was inflated. Total tourniquet time was approximately one hour and 25 minutes. This was set at 300 mm of mercury. I applied a percutaneous clamp to the fracture site after a sterile prep and drape, and reduced the fracture nearly anatomically.  I then placed the knee on a triangle, and made an anterior incision and split the patellar tendon and then retracted fibers laterally, placed my guidewire into the appropriate location.  Confirmation was made on AP and lateral views. This was introduced the appropriate depth, and then I opened the proximal tibia with the reamer. I then placed a long guidewire down the tibial shaft across the fracture site into the center center position in the distal fragment. I reduced the fracture anatomically on C-arm on AP and lateral views. Her fracture had an unusual S. type shape to the shaft. I sequentially reamed up to a size 11.5 mm reamer, and then placed the nail. Excellent alignment and fixation was achieved. I locked the nail proximally with a single screw in the static position, and I had fairly substantial intramedullary fit, so I did not need further fixation proximally. I fixed the nail with 2 medial to lateral screws distally, and applying a clamp to confirm rotational alignment as well as using clinical alignment including the intermalleolar axis compared to the tibial tubercle.  Final C-arm pictures were taken, and excellent bony apposition was achieved on both AP and lateral views. All the wounds were irrigated copiously, and the tendon was repaired with 0 Vicryl, followed by 3-0 for the subcutaneous tissue, with Steri-Strips and sterile gauze for the skin. A posterior splint was applied. She was awakened and returned to PACU in stable and satisfactory condition. There were no complications.

## 2011-07-28 NOTE — ED Notes (Signed)
Pt was given 200 mcg fentanyl en route.  20 g IV in L forearm.  Strong distal pulses.  Pt able to wiggle toes.  Skin tear/abrasion noted on inner aspect of foot.  Swelling/bruising noted to rt ankle.

## 2011-07-28 NOTE — H&P (Signed)
  PREOPERATIVE H&P  Chief Complaint: Right tibia fracture  HPI: Bonnie Kelly is a 36 y.o. female who presents for preoperative history and physical with a diagnosis of Right tibia fracture. Symptoms are rated as moderate to severe, and have been worsening.  This is significantly impairing activities of daily living.  She has elected for surgical management. She apparently twisted her knee last night, and then went home. She was having ongoing knee pain, and then she apparently twisted and injured her right leg. She was unable to walk was brought into the emergency room. She was not having any bleeding, but had acute severe pain.  She does smoke, and I recommended that she quit. She is not extended until the patches.  Past Medical History  Diagnosis Date  . Hypertension   . Anemia   . Anemia    Past Surgical History  Procedure Date  . Birth control implant in l arm    History   Social History  . Marital Status: Widowed    Spouse Name: N/A    Number of Children: N/A  . Years of Education: N/A   Social History Main Topics  . Smoking status: Current Everyday Smoker -- 1.0 packs/day for 24 years    Types: Cigarettes  . Smokeless tobacco: None  . Alcohol Use: 1.8 oz/week    1 Cans of beer, 2 Shots of liquor per week     rare  . Drug Use: No  . Sexually Active: Not Currently    Birth Control/ Protection: Implant   Other Topics Concern  . None   Social History Narrative  . None   Family History  Problem Relation Age of Onset  . Diabetes Mother   . Hypertension Mother   . Heart failure Mother    No Known Allergies Prior to Admission medications   Medication Sig Start Date End Date Taking? Authorizing Provider  acetaminophen (TYLENOL) 500 MG tablet Take 1,000 mg by mouth every 6 (six) hours as needed. For pain   Yes Historical Provider, MD  etonogestrel (IMPLANON) 68 MG IMPL implant Inject 1 each into the skin once.   Yes Historical Provider, MD     Positive ROS:  All other systems have been reviewed and were otherwise negative with the exception of those mentioned in the HPI and as above.   Physical Exam: General: Alert, no acute distress Cardiovascular: No pedal edema Respiratory: No cyanosis, no use of accessory musculature GI: No organomegaly, abdomen is soft and non-tender Skin: No lesions in the area of chief complaint Neurologic: Sensation intact distally Psychiatric: Patient is competent for consent with normal mood and affect Lymphatic: No axillary or cervical lymphadenopathy  MUSCULOSKELETAL: Right leg is in a splint, EHL and FHL are firing. Sensation is intact at the foot. Her dorsalis pedis pulses intact.  Assessment: Right tibia fracture  Plan: Plan for Procedure(s): INTRAMEDULLARY (IM) NAIL TIBIAL  The risks benefits and alternatives were discussed with the patient including but not limited to the risks of nonoperative treatment, versus surgical intervention including infection, bleeding, nerve injury, malunion, nonunion, the need for revision surgery, hardware prominence, hardware failure, the need for hardware removal, blood clots, cardiopulmonary complications, morbidity, mortality, among others, and they were willing to proceed.     Nakesha Ebrahim P, MD Cell [phone] Pager [phone]  07/28/2011 4:33 PM

## 2011-07-28 NOTE — ED Notes (Signed)
YNW:[id number]<OZ> Expected date:07/28/11<BR> Expected time: 8:57 AM<BR> Means of arrival:Ambulance<BR> Comments:<BR> Ankle injury
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
## 2011-07-28 NOTE — Discharge Instructions (Signed)
Tibial Fracture, Adult  You have a fracture (break in bone) of your tibia. This is the large "shin" bone in your lower leg. These fractures are easily diagnosed with x-rays.  TREATMENT   You have a simple fracture which usually will heal without disability. It can be treated with simple immobilization. This means the bone can be held with a cast or splint in a favorable position until your caregiver feels it is stable (healed well enough). Then you can begin range of motion exercises to keep your knee and ankle limber (moving well).  HOME CARE INSTRUCTIONS    Apply ice to the injury for 15 to 20 minutes, 3 to 4 times per day while awake, for 2 days. Put the ice in a plastic bag and place a thin towel between the bag of ice and your cast.   If you have a plaster or fiberglass cast:   Do not try to scratch the skin under the cast using sharp or pointed objects.   Check the skin around the cast every day. You may put lotion on any red or sore areas.   Keep your cast dry and clean.   If you have a plaster splint:   Wear the splint as directed.   You may loosen the elastic around the splint if your toes become numb, tingle, or turn cold or blue.   Do not put pressure on any part of your cast or splint until it is fully hardened.   Your cast or splint can be protected during bathing with a plastic bag. Do not lower the cast or splint into water.   Use crutches as directed.   Only take over-the-counter or prescription medicines for pain, discomfort, or fever as directed by your caregiver.   See your caregiver as directed. It is very important to keep all follow-up referrals and appointments in order to avoid any long-term problems with your leg and ankle including chronic pain, inability to move the ankle normally, failure of the fracture to heal and permanent disability.  SEEK IMMEDIATE MEDICAL CARE IF:    Pain is becoming worse rather than better, or if pain is uncontrolled with medications.   You have  increased swelling, pain, or redness in the foot.   You begin to lose feeling in your foot or toes.   You develop a cold or blue foot or toes on the injured side.   You develop severe pain in your injured leg, especially if it is increased with movement of your toes.  Document Released: 10/09/2000 Document Revised: 01/03/2011 Document Reviewed: 05/03/2008  [employer] Patient Information 2012 [employer]

## 2011-07-28 NOTE — ED Notes (Signed)
Per EMS: Pt states that she hurt her left knee last night and it has been tender since.  This morning she was standing in her kitchen and felt a twinge of pain in her left knee.  She began to feel faint from the pain and tried to put all of her weight on her right foot.  Her foot twisted as she fell.  Pt has external rotation of her left foot.  Pt heard a pop when she fell.  Distal pulses still present.
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
## 2011-07-28 NOTE — Anesthesia Procedure Notes (Signed)
Procedure Name: Intubation Date/Time: 07/28/2011 5:23 PM Performed by: Julianne Rice Z Pre-anesthesia Checklist: Patient identified, Timeout performed, Emergency Drugs available, Suction available and Patient being monitored Patient Re-evaluated:Patient Re-evaluated prior to inductionOxygen Delivery Method: Circle system utilized Preoxygenation: Pre-oxygenation with 100% oxygen Intubation Type: Cricoid Pressure applied Laryngoscope Size: Mac and 3 Grade View: Grade I Tube type: Oral Tube size: 7.5 mm Number of attempts: 1 Airway Equipment and Method: Stylet Placement Confirmation: ETT inserted through vocal cords under direct vision,  breath sounds checked- equal and bilateral and positive ETCO2 Secured at: 23 cm Tube secured with: Tape Dental Injury: Teeth and Oropharynx as per pre-operative assessment

## 2011-07-28 NOTE — Progress Notes (Signed)
Orthopedic Tech Progress Note Patient Details:  Bonnie Kelly 07-12-1975 [id number]  Patient ID: [name], female   DOB: 1975/02/08, 36 y.o.   MRN: [id number] Trapeze bar patient helper  [name] 07/28/2011, 9:59 PM

## 2011-07-28 NOTE — ED Notes (Signed)
Assisted with cast placement on right lower leg.

## 2011-07-28 NOTE — Preoperative (Signed)
Beta Blockers   Reason not to administer Beta Blockers:Not Applicable 

## 2011-07-29 ENCOUNTER — Inpatient Hospital Stay (HOSPITAL_COMMUNITY): Payer: Medicaid Other

## 2011-07-29 ENCOUNTER — Encounter (HOSPITAL_COMMUNITY): Payer: Self-pay | Admitting: Orthopedic Surgery

## 2011-07-29 MED ORDER — CEFAZOLIN SODIUM-DEXTROSE 2-3 GM-% IV SOLR
2.0000 g | Freq: Four times a day (QID) | INTRAVENOUS | Status: AC
  Administered 2011-07-29 (×2): 2 g via INTRAVENOUS
  Filled 2011-07-29 (×2): qty 50

## 2011-07-29 MED ORDER — HYDROMORPHONE HCL PF 1 MG/ML IJ SOLN
0.5000 mg | INTRAMUSCULAR | Status: DC | PRN
  Administered 2011-07-29 (×2): 1 mg via INTRAVENOUS
  Administered 2011-07-29: 0.5 mg via INTRAVENOUS
  Administered 2011-07-29: 1 mg via INTRAVENOUS
  Filled 2011-07-29 (×3): qty 1

## 2011-07-29 MED ORDER — DIAZEPAM 5 MG PO TABS
5.0000 mg | ORAL_TABLET | Freq: Four times a day (QID) | ORAL | Status: DC | PRN
  Administered 2011-07-29 – 2011-07-30 (×3): 5 mg via ORAL
  Filled 2011-07-29 (×3): qty 1

## 2011-07-29 MED ORDER — SENNA 8.6 MG PO TABS
1.0000 | ORAL_TABLET | Freq: Two times a day (BID) | ORAL | Status: DC
  Administered 2011-07-29 – 2011-07-30 (×3): 8.6 mg via ORAL
  Filled 2011-07-29 (×5): qty 1

## 2011-07-29 MED ORDER — OXYCODONE-ACETAMINOPHEN 5-325 MG PO TABS
1.0000 | ORAL_TABLET | Freq: Four times a day (QID) | ORAL | Status: DC | PRN
  Administered 2011-07-29 – 2011-07-30 (×4): 2 via ORAL
  Filled 2011-07-29 (×4): qty 2

## 2011-07-29 MED ORDER — ACETAMINOPHEN 325 MG PO TABS: 650.0000 mg | ORAL_TABLET | Freq: Four times a day (QID) | ORAL | Status: DC | PRN

## 2011-07-29 MED ORDER — BISACODYL 10 MG RE SUPP: 10.0000 mg | Freq: Every day | RECTAL | Status: DC | PRN

## 2011-07-29 MED ORDER — ZOLPIDEM TARTRATE 5 MG PO TABS
5.0000 mg | ORAL_TABLET | Freq: Every evening | ORAL | Status: DC | PRN
Start: 2011-07-29 — End: 2011-07-30

## 2011-07-29 MED ORDER — ONDANSETRON HCL 4 MG/2ML IJ SOLN: 4.0000 mg | Freq: Four times a day (QID) | INTRAMUSCULAR | Status: DC | PRN

## 2011-07-29 MED ORDER — ONDANSETRON HCL 4 MG PO TABS: 4.0000 mg | ORAL_TABLET | Freq: Four times a day (QID) | ORAL | Status: DC | PRN

## 2011-07-29 MED ORDER — DEXTROSE 5 % IV SOLN
500.0000 mg | Freq: Four times a day (QID) | INTRAVENOUS | Status: DC | PRN
  Filled 2011-07-29: qty 5

## 2011-07-29 MED ORDER — METOCLOPRAMIDE HCL 10 MG PO TABS
5.0000 mg | ORAL_TABLET | Freq: Three times a day (TID) | ORAL | Status: DC | PRN
Start: 2011-07-29 — End: 2011-07-30

## 2011-07-29 MED ORDER — ALUM & MAG HYDROXIDE-SIMETH 200-200-20 MG/5ML PO SUSP: 30.0000 mL | ORAL | Status: DC | PRN

## 2011-07-29 MED ORDER — METHOCARBAMOL 500 MG PO TABS
500.0000 mg | ORAL_TABLET | Freq: Four times a day (QID) | ORAL | Status: DC | PRN
  Administered 2011-07-29 – 2011-07-30 (×3): 500 mg via ORAL
  Filled 2011-07-29 (×3): qty 1

## 2011-07-29 MED ORDER — DOCUSATE SODIUM 100 MG PO CAPS
100.0000 mg | ORAL_CAPSULE | Freq: Two times a day (BID) | ORAL | Status: DC
  Administered 2011-07-29 – 2011-07-30 (×3): 100 mg via ORAL
  Filled 2011-07-29 (×4): qty 1

## 2011-07-29 MED ORDER — ENOXAPARIN SODIUM 40 MG/0.4ML ~~LOC~~ SOLN
40.0000 mg | SUBCUTANEOUS | Status: DC
  Administered 2011-07-29 – 2011-07-30 (×2): 40 mg via SUBCUTANEOUS
  Filled 2011-07-29 (×2): qty 0.4

## 2011-07-29 MED ORDER — OXYCODONE HCL 5 MG PO TABS
5.0000 mg | ORAL_TABLET | ORAL | Status: DC | PRN
  Administered 2011-07-29 (×2): 10 mg via ORAL
  Administered 2011-07-30: 5 mg via ORAL
  Filled 2011-07-29: qty 1
  Filled 2011-07-29: qty 2

## 2011-07-29 MED ORDER — HYDROMORPHONE HCL PF 1 MG/ML IJ SOLN
0.1000 mg | INTRAMUSCULAR | Status: DC | PRN
  Administered 2011-07-29: 0.1 mg via INTRAVENOUS
  Filled 2011-07-29 (×2): qty 1

## 2011-07-29 MED ORDER — METOCLOPRAMIDE HCL 5 MG/ML IJ SOLN: 5.0000 mg | Freq: Three times a day (TID) | INTRAMUSCULAR | Status: DC | PRN

## 2011-07-29 MED ORDER — PHENOL 1.4 % MT LIQD: 1.0000 | OROMUCOSAL | Status: DC | PRN

## 2011-07-29 MED ORDER — SENNOSIDES-DOCUSATE SODIUM 8.6-50 MG PO TABS
1.0000 | ORAL_TABLET | Freq: Every evening | ORAL | Status: DC | PRN
  Administered 2011-07-29 – 2011-07-30 (×2): 1 via ORAL
  Filled 2011-07-29 (×2): qty 1

## 2011-07-29 MED ORDER — ACETAMINOPHEN 650 MG RE SUPP: 650.0000 mg | Freq: Four times a day (QID) | RECTAL | Status: DC | PRN

## 2011-07-29 MED ORDER — MENTHOL 3 MG MT LOZG: 1.0000 | LOZENGE | OROMUCOSAL | Status: DC | PRN

## 2011-07-29 NOTE — Progress Notes (Signed)
CARE MANAGEMENT NOTE 07/29/2011  Patient:  Bonnie Kelly, Bonnie Kelly   Account Number:  [id number]  Date Initiated:  07/29/2011  Documentation initiated by:  Vance Peper  Subjective/Objective Assessment:   35 right tibial IM Nail     Action/Plan:   CM spoke with patient regarding home health needs and DME. Choice offered.   Anticipated DC Date:  07/30/2011   Anticipated DC Plan:  HOME W HOME HEALTH SERVICES      DC Planning Services  CM consult      PAC Choice  DURABLE MEDICAL EQUIPMENT  HOME HEALTH   Choice offered to / List presented to:  C-1 Patient   DME arranged  3-N-1  Levan Hurst      DME agency  [facility]     HH arranged  HH-2 PT      [facility] agency  [facility]   Status of service:  Completed, signed off Medicare Important Message given?   (If response is "NO", the following Medicare IM given date fields will be blank) Date Medicare IM given:   Date Additional Medicare IM given:    Discharge Disposition:  HOME W HOME HEALTH SERVICES  Per UR Regulation:    If discussed at Long Length of Stay Meetings, dates discussed:    Comments:

## 2011-07-29 NOTE — Progress Notes (Signed)
Subjective:  Patient reports pain as severe.    Objective:   VITALS:   Filed Vitals:   07/28/11 2030 07/28/11 2108 07/28/11 2150 07/29/11 0550  BP: 152/92 160/97 148/92 143/82  Pulse: 93 93 89 93  Temp:  98.2 F (36.8 C) 97.3 F (36.3 C) 99.3 F (37.4 C)  TempSrc:   Oral   Resp: 18 16 18 18   SpO2: 100% 99% 100% 100%    Neurologically intact Sensation intact distally Dorsiflexion/Plantar flexion intact  LABS  Results for orders placed during the hospital encounter of 07/28/11 (from the past 24 hour(s))  CBC     Status: Abnormal   Collection Time   07/28/11 11:04 AM      Component Value Range   WBC 11.0 (*) 4.0 - 10.5 K/uL   RBC 4.44  3.87 - 5.11 MIL/uL   Hemoglobin 14.4  12.0 - 15.0 g/dL   HCT 98.1  19.1 - 47.8 %   MCV 93.2  78.0 - 100.0 fL   MCH 32.4  26.0 - 34.0 pg   MCHC 34.8  30.0 - 36.0 g/dL   RDW 29.5  62.1 - 30.8 %   Platelets 228  150 - 400 K/uL  URINALYSIS, ROUTINE W REFLEX MICROSCOPIC     Status: Abnormal   Collection Time   07/28/11 11:55 AM      Component Value Range   Color, Urine YELLOW  YELLOW   APPearance CLEAR  CLEAR   Specific Gravity, Urine 1.027  1.005 - 1.030   pH 6.5  5.0 - 8.0   Glucose, UA NEGATIVE  NEGATIVE mg/dL   Hgb urine dipstick SMALL (*) NEGATIVE   Bilirubin Urine NEGATIVE  NEGATIVE   Ketones, ur 15 (*) NEGATIVE mg/dL   Protein, ur NEGATIVE  NEGATIVE mg/dL   Urobilinogen, UA 0.2  0.0 - 1.0 mg/dL   Nitrite NEGATIVE  NEGATIVE   Leukocytes, UA NEGATIVE  NEGATIVE  URINE MICROSCOPIC-ADD ON     Status: Normal   Collection Time   07/28/11 11:55 AM      Component Value Range   Squamous Epithelial / LPF RARE  RARE   RBC / HPF 0-2  <3 RBC/hpf    Dg Tibia/fibula Right  07/28/2011  *RADIOLOGY REPORT*  Clinical Data: Operative reduction and internal fixation of tibial spiral fracture.  DG C-ARM 61-120 MIN,RIGHT TIBIA AND FIBULA - 2 VIEW  Comparison:  07/28/2011 at 10:08 a.m.  Findings: Four fluoroscopic spot images of the tibia  and fibula demonstrate an antegrade intramedullary rod with single proximal and two distal interlocking screws traversing the spiral fracture of the tibia, with markedly improved alignment along the fracture site.  Proximal fibular fracture noted.  IMPRESSION:  1.  Operative reduction and internal fixation of the tibial spiral fracture using an intramedullary rod, with marked improvement in alignment and without complicating feature observed.  Original Report Authenticated By: Dellia Cloud, M.D.   Dg Tibia/fibula Right  07/28/2011  *RADIOLOGY REPORT*  Clinical Data: Ankle injury  RIGHT TIBIA AND FIBULA - 2 VIEW  Comparison: None  Findings: There is an acute fracture involving the proximal diaphysis of the fibula.  There is also an oblique spiral fracture involving the distal shaft of the tibia.  There is mild medial displacement of the distal fracture fragments.  IMPRESSION:  1.  Acute fractures of the proximal fibula and distal tibia.  Original Report Authenticated By: Rosealee Albee, M.D.   Dg Ankle Complete Right  07/28/2011  *  RADIOLOGY REPORT*  Clinical Data: Ankle injury  RIGHT ANKLE - COMPLETE 3+ VIEW  Comparison: 03/09/2009  Findings: There is an acute spiral fracture involving the distal diaphysis of the right tibia.  There is lateral displacement of the distal shaft fracture fragments by one half shaft's width.  IMPRESSION:  1.  Acute spiral fracture involves the distal diaphysis of the right tibia.  Original Report Authenticated By: Rosealee Albee, M.D.   Dg Knee Complete 4 Views Left  07/28/2011  *RADIOLOGY REPORT*  Clinical Data: Pain no injury.  Fall.  LEFT KNEE - COMPLETE 4+ VIEW  Comparison: 09/06/2008  Findings: There is no joint effusion.  No fracture or subluxation identified.  No radio-opaque foreign bodies or soft tissue calcification identified.  IMPRESSION: No acute findings.  Original Report Authenticated By: Rosealee Albee, M.D.   Dg C-arm [phone] Min  07/28/2011   *RADIOLOGY REPORT*  Clinical Data: Operative reduction and internal fixation of tibial spiral fracture.  DG C-ARM 61-120 MIN,RIGHT TIBIA AND FIBULA - 2 VIEW  Comparison:  07/28/2011 at 10:08 a.m.  Findings: Four fluoroscopic spot images of the tibia and fibula demonstrate an antegrade intramedullary rod with single proximal and two distal interlocking screws traversing the spiral fracture of the tibia, with markedly improved alignment along the fracture site.  Proximal fibular fracture noted.  IMPRESSION:  1.  Operative reduction and internal fixation of the tibial spiral fracture using an intramedullary rod, with marked improvement in alignment and without complicating feature observed.  Original Report Authenticated By: Dellia Cloud, M.D.    Assessment/Plan: 1 Day Post-Op   Principal Problem:  *Closed fracture of right tibia and fibula   Advance diet Up with therapy Plan for discharge tomorrow  Will add valium to help with spasms and pain control.   Bonnie Kelly P 07/29/2011, 8:11 AM   Bonnie Lucy, MD Cell [phone] Pager [phone]

## 2011-07-29 NOTE — Progress Notes (Signed)
Orthopedic Tech Progress Note Patient Details:  Bonnie Kelly [birth year] [id number]  Patient ID: LUMI WINSLETT, female   DOB: 1975/02/09, 36 y.o.   MRN: [id number]  Confirmed patient has OHF. Leo Grosser T 07/29/2011, 12:48 PM

## 2011-07-29 NOTE — Evaluation (Signed)
Physical Therapy Evaluation Patient Details Name: Bonnie Kelly MRN: [id number] DOB: [birth year] Today's Date: 07/29/2011 Time: 1191-4782 PT Time Calculation (min): 33 min  PT Assessment / Plan / Recommendation Clinical Impression  Pt is a 36 y/o female s/p ORIF for R Tib/Fib fx. Acute PT to follow pt to maximize safety and independence with mobility.      PT Assessment  Patient needs continued PT services    Follow Up Recommendations  Home health PT;Supervision for mobility/OOB    Barriers to Discharge        Equipment Recommendations  Rolling walker with 5" wheels;3 in 1 bedside comode    Recommendations for Other Services     Frequency Min 5X/week    Precautions / Restrictions Precautions Precautions: Fall Restrictions Weight Bearing Restrictions: Yes RLE Weight Bearing: Non weight bearing   Pertinent Vitals/Pain Pain 7/10 in R Lower leg.  RN notified.  Pt due for pain meds at 1430.      Mobility  Bed Mobility Bed Mobility: Supine to Sit;Sitting - Scoot to Edge of Bed Supine to Sit: 4: Min assist;HOB flat Sitting - Scoot to [place] of Bed: 4: Min assist Details for Bed Mobility Assistance: Assist to manage R LE secondary to pain.  Transfers Transfers: Sit to Stand;Stand to Sit Sit to Stand: 4: Min assist;From bed;With upper extremity assist Stand to Sit: 4: Min assist;To chair/3-in-1;With upper extremity assist;With armrests Details for Transfer Assistance: Verbal and tactile cues for technique.  Assist to steady pt, cues for NWB in RLE.  Ambulation/Gait Ambulation/Gait Assistance: 4: Min guard Ambulation Distance (Feet): 20 Feet Assistive device: Rolling walker Ambulation/Gait Assistance Details: cues for technique and NWB on R LE.   Gait Pattern: Step-to pattern Stairs: No    Exercises Total Joint Exercises Quad Sets: 5 reps;Right;Seated Gluteal Sets: 5 reps;Both;Seated   PT Diagnosis: Difficulty walking;Acute pain  PT Problem List: Decreased activity  tolerance;Decreased mobility;Decreased safety awareness;Decreased knowledge of use of DME;Decreased knowledge of precautions;Pain PT Treatment Interventions: Gait training;Stair training;DME instruction;Functional mobility training;Therapeutic activities;Therapeutic exercise;Patient/family education   PT Goals Acute Rehab PT Goals PT Goal Formulation: With patient Time For Goal Achievement: 08/05/11 Potential to Achieve Goals: Good Pt will go Supine/Side to Sit: with modified independence;with HOB 0 degrees PT Goal: Supine/Side to Sit - Progress: Goal set today Pt will go Sit to Supine/Side: with modified independence;with HOB 0 degrees PT Goal: Sit to Supine/Side - Progress: Goal set today Pt will Transfer Bed to Chair/Chair to Bed: with supervision PT Transfer Goal: Bed to Chair/Chair to Bed - Progress: Goal set today Pt will Ambulate: 1 - 15 feet;with modified independence;with least restrictive assistive device PT Goal: Ambulate - Progress: Goal set today Pt will Go Up / Down Stairs: 1-2 stairs;with supervision;with rolling walker PT Goal: Up/Down Stairs - Progress: Goal set today Pt will Perform Home Exercise Program: Independently PT Goal: Perform Home Exercise Program - Progress: Goal set today  Visit Information  Last PT Received On: 07/29/11    Subjective Data      Prior Functioning  Home Living Lives With: Family Available Help at Discharge: Family Type of Home: House Home Access: Stairs to enter Secretary/administrator of Steps: 2 Entrance Stairs-Rails: None Home Layout: One level Bathroom Shower/Tub: Forensic scientist: Standard Bathroom Accessibility: Yes How Accessible: Accessible via walker Prior Function Level of Independence: Independent Able to Take Stairs?: Yes Driving: Yes Vocation: Volunteer work Musician: No difficulties Dominant Hand: Right    Cognition  Overall Cognitive Status: Appears  within functional  limits for tasks assessed/performed Arousal/Alertness: Lethargic Orientation Level: Oriented X4 / Intact Behavior During Session: [facility] for tasks performed    Extremity/Trunk Assessment Right Upper Extremity Assessment RUE ROM/Strength/Tone: Within functional levels Left Upper Extremity Assessment LUE ROM/Strength/Tone: Within functional levels Right Lower Extremity Assessment RLE ROM/Strength/Tone: Deficits;Due to pain RLE ROM/Strength/Tone Deficits: pt unable to raise RLE against gravity without assistance.  Splint on lower leg  and foot.   Left Lower Extremity Assessment LLE ROM/Strength/Tone: Within functional levels Trunk Assessment Trunk Assessment: Normal   Balance Balance Balance Assessed: No  End of Session PT - End of Session Equipment Utilized During Treatment: Gait belt Activity Tolerance: Patient tolerated treatment well Patient left: in chair;with call bell/phone within reach Nurse Communication: Mobility status  GP     Keenen Roessner 07/29/2011, 5:14 PM  Taheem Fricke L. Terrian Sentell DPT [phone]

## 2011-07-29 NOTE — Progress Notes (Signed)
UR COMPLETED  

## 2011-07-30 LAB — CBC
HCT: 34.2 % — ABNORMAL LOW (ref 36.0–46.0)
MCH: 31.9 pg (ref 26.0–34.0)
MCHC: 33.6 g/dL (ref 30.0–36.0)
MCV: 95 fL (ref 78.0–100.0)
Platelets: 192 10*3/uL (ref 150–400)
RDW: 12 % (ref 11.5–15.5)
WBC: 5.9 10*3/uL (ref 4.0–10.5)

## 2011-07-30 LAB — BASIC METABOLIC PANEL
BUN: 9 mg/dL (ref 6–23)
Calcium: 8.2 mg/dL — ABNORMAL LOW (ref 8.4–10.5)
Chloride: 102 mEq/L (ref 96–112)
Creatinine, Ser: 0.81 mg/dL (ref 0.50–1.10)
GFR calc Af Amer: >90 mL/min (ref >90)
GFR calc non Af Amer: >90 mL/min (ref >90)

## 2011-07-30 NOTE — Progress Notes (Signed)
Physical Therapy Treatment Patient Details Name: Bonnie Kelly MRN: [id number] DOB: 11-09-1975 Today's Date: 07/30/2011 Time: 4098-1191 PT Time Calculation (min): 24 min  PT Assessment / Plan / Recommendation Comments on Treatment Session  Pt moves well despite pain.  Initiated stair training today-  Pt did very well with stairs.  Handout provided.      Follow Up Recommendations  Home health PT;Supervision for mobility/OOB    Barriers to Discharge        Equipment Recommendations  Rolling walker with 5" wheels;3 in 1 bedside comode    Recommendations for Other Services    Frequency Min 5X/week   Plan Discharge plan remains appropriate    Precautions / Restrictions Precautions Precautions: Fall Restrictions Weight Bearing Restrictions: Yes RLE Weight Bearing: Non weight bearing     Pertinent Vitals/Pain 10/10  RLE.  RN notified.  Repositioned.      Mobility  Bed Mobility Bed Mobility: Supine to Sit Supine to Sit: 4: Min assist;HOB flat Sitting - Scoot to Edge of Bed: 4: Min assist Details for Bed Mobility Assistance: (A) for RLE.   Transfers Transfers: Sit to Stand;Stand to Sit Sit to Stand: 4: Min guard;With upper extremity assist;With armrests;From bed;From chair/3-in-1 Stand to Sit: 4: Min guard;With upper extremity assist;With armrests;To chair/3-in-1 Details for Transfer Assistance: Cues for safest hand placement.   Ambulation/Gait Ambulation/Gait Assistance: 5: Supervision Ambulation Distance (Feet): 40 Feet Assistive device: Rolling walker Ambulation/Gait Assistance Details: Supervision for safety.  Gait Pattern: Step-to pattern Stairs: Yes Stairs Assistance: 4: Min assist Stairs Assistance Details (indicate cue type and reason): (A) to position & stabilize RW, steady pt.  Cues for technique.   Stair Management Technique: No rails;Step to pattern;Backwards;With walker Number of Stairs: 2  Wheelchair Mobility Wheelchair Mobility: No      PT  Goals Acute Rehab PT Goals Time For Goal Achievement: 08/05/11 Potential to Achieve Goals: Good PT Goal: Supine/Side to Sit - Progress: Not met PT Goal: Ambulate - Progress: Progressing toward goal PT Goal: Up/Down Stairs - Progress: Progressing toward goal  Visit Information  Last PT Received On: 07/30/11 Assistance Needed: +1    Subjective Data      Cognition  Overall Cognitive Status: Appears within functional limits for tasks assessed/performed Arousal/Alertness: Awake/alert Orientation Level: Oriented X4 / Intact Behavior During Session: [facility] for tasks performed    Balance  Balance Balance Assessed: No  End of Session PT - End of Session Equipment Utilized During Treatment: Gait belt Activity Tolerance: Patient tolerated treatment well Patient left: in chair;with call bell/phone within reach Nurse Communication: Mobility status;Other (comment) (pain)   GP     Lara Mulch 07/30/2011, 10:37 AM  Verdell Face, PTA [phone] 07/30/2011

## 2011-07-30 NOTE — Progress Notes (Signed)
Order received, chart reviewed, in to speak with patient about role of OT. Pt reports that she does not foresee any issues with BADLs and if so her mom can help her. Did give pt handout on tub transfer bench in case she decides she wants one due to NWB'ing status. No OT needs found, eval not completed, will sign off.  Bonnie Kelly, [place] [phone] 07/30/2011

## 2011-07-30 NOTE — Discharge Summary (Signed)
Physician Discharge Summary  Patient ID: Bonnie Kelly MRN: [id number] DOB/AGE: 07/26/1975 36 y.o.  Admit date: 07/28/2011 Discharge date: 07/30/2011  Admission Diagnoses:  Closed fracture of right tibia and fibula  Discharge Diagnoses:  Principal Problem:  *Closed fracture of right tibia and fibula   Past Medical History  Diagnosis Date  . Hypertension   . Anemia   . Anemia   . Closed fracture of right tibia and fibula 07/28/2011    Surgeries: Procedure(s): INTRAMEDULLARY (IM) NAIL TIBIAL on 07/28/2011   Consultants (if any): Treatment Team:  Eulas Post, MD  Discharged Condition: Improved  Hospital Course: HETHER Kelly is an 36 y.o. female who was admitted 07/28/2011 with a diagnosis of Closed fracture of right tibia and fibula and went to the operating room on 07/28/2011 and underwent the above named procedures.    She was given perioperative antibiotics:  Anti-infectives     Start     Dose/Rate Route Frequency Ordered Stop   07/29/11 0809   ceFAZolin (ANCEF) IVPB 2 g/50 mL premix        2 g 100 mL/hr over 30 Minutes Intravenous Every 6 hours 07/29/11 0809 07/29/11 1602        .  She was given sequential compression devices, early ambulation, and lovenox for DVT prophylaxis.  She benefited maximally from the hospital stay and there were no complications.    Recent vital signs:  Filed Vitals:   07/30/11 0800  BP:   Pulse:   Temp:   Resp: 14    Recent laboratory studies:  Lab Results  Component Value Date   HGB 11.5* 07/30/2011   HGB 14.4 07/28/2011   HGB 15.1* 05/07/2011   Lab Results  Component Value Date   WBC 5.9 07/30/2011   PLT 192 07/30/2011   No results found for this basename: INR   Lab Results  Component Value Date   NA 134* 07/30/2011   K 3.5 07/30/2011   CL 102 07/30/2011   CO2 24 07/30/2011   BUN 9 07/30/2011   CREATININE 0.81 07/30/2011   GLUCOSE 111* 07/30/2011    Discharge Medications:   Medication List  As of 07/30/2011 11:56 AM   TAKE  these medications         acetaminophen 500 MG tablet   Commonly known as: TYLENOL   Take 1,000 mg by mouth every 6 (six) hours as needed. For pain      enoxaparin 40 MG/0.4ML injection   Commonly known as: LOVENOX   Inject 0.4 mLs (40 mg total) into the skin daily.      etonogestrel 68 MG Impl implant   Commonly known as: IMPLANON   Inject 1 each into the skin once.      methocarbamol 500 MG tablet   Commonly known as: ROBAXIN   Take 1 tablet (500 mg total) by mouth 4 (four) times daily.      oxyCODONE-acetaminophen 10-325 MG per tablet   Commonly known as: PERCOCET   Take 1-2 tablets by mouth every 6 (six) hours as needed for pain. MAXIMUM TOTAL ACETAMINOPHEN DOSE IS 4000 MG PER DAY      sennosides-docusate sodium 8.6-50 MG tablet   Commonly known as: SENOKOT-S   Take 1 tablet by mouth daily.            Diagnostic Studies: Dg Tibia/fibula Right  07/28/2011  *RADIOLOGY REPORT*  Clinical Data: Operative reduction and internal fixation of tibial spiral fracture.  DG C-ARM 61-120 MIN,RIGHT TIBIA AND  FIBULA - 2 VIEW  Comparison:  07/28/2011 at 10:08 a.m.  Findings: Four fluoroscopic spot images of the tibia and fibula demonstrate an antegrade intramedullary rod with single proximal and two distal interlocking screws traversing the spiral fracture of the tibia, with markedly improved alignment along the fracture site.  Proximal fibular fracture noted.  IMPRESSION:  1.  Operative reduction and internal fixation of the tibial spiral fracture using an intramedullary rod, with marked improvement in alignment and without complicating feature observed.  Original Report Authenticated By: Dellia Cloud, M.D.   Dg Tibia/fibula Right  07/28/2011  *RADIOLOGY REPORT*  Clinical Data: Ankle injury  RIGHT TIBIA AND FIBULA - 2 VIEW  Comparison: None  Findings: There is an acute fracture involving the proximal diaphysis of the fibula.  There is also an oblique spiral fracture involving the  distal shaft of the tibia.  There is mild medial displacement of the distal fracture fragments.  IMPRESSION:  1.  Acute fractures of the proximal fibula and distal tibia.  Original Report Authenticated By: Rosealee Albee, M.D.   Dg Ankle Complete Right  07/28/2011  *RADIOLOGY REPORT*  Clinical Data: Ankle injury  RIGHT ANKLE - COMPLETE 3+ VIEW  Comparison: 03/09/2009  Findings: There is an acute spiral fracture involving the distal diaphysis of the right tibia.  There is lateral displacement of the distal shaft fracture fragments by one half shaft's width.  IMPRESSION:  1.  Acute spiral fracture involves the distal diaphysis of the right tibia.  Original Report Authenticated By: Rosealee Albee, M.D.   Dg Knee Complete 4 Views Left  07/28/2011  *RADIOLOGY REPORT*  Clinical Data: Pain no injury.  Fall.  LEFT KNEE - COMPLETE 4+ VIEW  Comparison: 09/06/2008  Findings: There is no joint effusion.  No fracture or subluxation identified.  No radio-opaque foreign bodies or soft tissue calcification identified.  IMPRESSION: No acute findings.  Original Report Authenticated By: Rosealee Albee, M.D.   Dg Tibia/fibula Right Port  07/29/2011  *RADIOLOGY REPORT*  Clinical Data: Status post intramedullary nail  PORTABLE RIGHT TIBIA AND FIBULA - 2 VIEW  Comparison: 07/28/2011  Findings: Four views of the right tibia-fibula submitted.  The study is limited by casting material artifact.  Again noted status post open reduction internal fixation of tibial fracture with intramedullary rod in place.  There is near anatomic alignment. Plantar spur of the calcaneus again noted.  IMPRESSION: The study is limited by casting material artifact.  Again noted status post open reduction internal fixation of tibial fracture with intramedullary rod in place.  There is near anatomic alignment.  Original Report Authenticated By: Natasha Mead, M.D.   Dg C-arm 16-109 Min  07/28/2011  *RADIOLOGY REPORT*  Clinical Data: Operative reduction  and internal fixation of tibial spiral fracture.  DG C-ARM 61-120 MIN,RIGHT TIBIA AND FIBULA - 2 VIEW  Comparison:  07/28/2011 at 10:08 a.m.  Findings: Four fluoroscopic spot images of the tibia and fibula demonstrate an antegrade intramedullary rod with single proximal and two distal interlocking screws traversing the spiral fracture of the tibia, with markedly improved alignment along the fracture site.  Proximal fibular fracture noted.  IMPRESSION:  1.  Operative reduction and internal fixation of the tibial spiral fracture using an intramedullary rod, with marked improvement in alignment and without complicating feature observed.  Original Report Authenticated By: Dellia Cloud, M.D.    Disposition: 01-Home or Self Care  Discharge Orders    Future Orders Please Complete By Expires   Diet  general      Call MD / Call 911      Comments:   If you experience chest pain or shortness of breath, CALL 911 and be transported to the hospital emergency room.  If you develope a fever above 101 F, pus (white drainage) or increased drainage or redness at the wound, or calf pain, call your surgeon's office.   Discharge instructions      Comments:   Change dressing in 3 days and reapply fresh dressing, unless you have a splint (half cast).  If you have a splint/cast, just leave in place until your follow-up appointment.    Keep wounds dry for 3 weeks.  Leave steri-strips in place on skin.  Do not apply lotion or anything to the wound.   Constipation Prevention      Comments:   Drink plenty of fluids.  Prune juice may be helpful.  You may use a stool softener, such as Colace (over the counter) 100 mg twice a day.  Use MiraLax (over the counter) for constipation as needed.   Non weight bearing      Scheduling Instructions:   Right leg      Follow-up Information    Follow up with Jamail Cullers P, MD in 2 weeks.   Contact information:   [facility] [address], [address] [place] [address] [phone]           Signed: Eulas Post 07/30/2011, 11:56 AM

## 2011-11-29 HISTORY — PX: OTHER SURGICAL HISTORY: SHX169

## 2012-04-19 ENCOUNTER — Emergency Department (HOSPITAL_COMMUNITY)
Admission: EM | Admit: 2012-04-19 | Discharge: 2012-04-19 | Disposition: A | Discharge status: Home / Self Care | Payer: Medicaid Other | Attending: Emergency Medicine | Admitting: Emergency Medicine

## 2012-04-19 ENCOUNTER — Encounter (HOSPITAL_COMMUNITY): Payer: Self-pay | Admitting: Emergency Medicine

## 2012-04-19 ENCOUNTER — Emergency Department (HOSPITAL_COMMUNITY): Payer: Medicaid Other

## 2012-04-19 DIAGNOSIS — Z87828 Personal history of other (healed) physical injury and trauma: Secondary | ICD-10-CM | POA: Insufficient documentation

## 2012-04-19 DIAGNOSIS — W108XXA Fall (on) (from) other stairs and steps, initial encounter: Secondary | ICD-10-CM | POA: Insufficient documentation

## 2012-04-19 DIAGNOSIS — F17200 Nicotine dependence, unspecified, uncomplicated: Secondary | ICD-10-CM | POA: Insufficient documentation

## 2012-04-19 DIAGNOSIS — Z79899 Other long term (current) drug therapy: Secondary | ICD-10-CM | POA: Insufficient documentation

## 2012-04-19 DIAGNOSIS — S93401A Sprain of unspecified ligament of right ankle, initial encounter: Secondary | ICD-10-CM

## 2012-04-19 DIAGNOSIS — S93409A Sprain of unspecified ligament of unspecified ankle, initial encounter: Secondary | ICD-10-CM | POA: Insufficient documentation

## 2012-04-19 DIAGNOSIS — Y9389 Activity, other specified: Secondary | ICD-10-CM | POA: Insufficient documentation

## 2012-04-19 DIAGNOSIS — I10 Essential (primary) hypertension: Secondary | ICD-10-CM | POA: Insufficient documentation

## 2012-04-19 DIAGNOSIS — Z862 Personal history of diseases of the blood and blood-forming organs and certain disorders involving the immune mechanism: Secondary | ICD-10-CM | POA: Insufficient documentation

## 2012-04-19 DIAGNOSIS — Z8781 Personal history of (healed) traumatic fracture: Secondary | ICD-10-CM | POA: Insufficient documentation

## 2012-04-19 DIAGNOSIS — W010XXA Fall on same level from slipping, tripping and stumbling without subsequent striking against object, initial encounter: Secondary | ICD-10-CM | POA: Insufficient documentation

## 2012-04-19 DIAGNOSIS — Y929 Unspecified place or not applicable: Secondary | ICD-10-CM | POA: Insufficient documentation

## 2012-04-19 MED ORDER — HYDROCODONE-ACETAMINOPHEN 5-325 MG PO TABS: 1.0000 | ORAL_TABLET | Freq: Four times a day (QID) | ORAL | Status: DC | PRN

## 2012-04-19 MED ORDER — IBUPROFEN 800 MG PO TABS: 800.0000 mg | ORAL_TABLET | Freq: Three times a day (TID) | ORAL | Status: DC | PRN

## 2012-04-19 NOTE — ED Provider Notes (Signed)
History     CSN: [id number]  Arrival date & time 04/19/12  1321   First MD Initiated Contact with Patient 04/19/12 1354      Chief Complaint  Patient presents with  . Ankle Pain    (Consider location/radiation/quality/duration/timing/severity/associated sxs/prior treatment) HPI The patient presents to the ER with R ankle injury following a fall. The patient states that she -previously injury that ankle. The patient states that she slipped on some stairs and twisted her ankle. The patient states she tried Ibuprofen without relief. The patient states that movement and palpation make the pain worse.    Past Medical History  Diagnosis Date  . Hypertension   . Anemia   . Anemia   . Closed fracture of right tibia and fibula 07/28/2011    Past Surgical History  Procedure Laterality Date  . Birth control implant in l arm    . Tibia im nail insertion  07/28/2011    Procedure: INTRAMEDULLARY (IM) NAIL TIBIAL;  Surgeon: Eulas Post, MD;  Location: [facility] OR;  Service: Orthopedics;  Laterality: Right;    Family History  Problem Relation Age of Onset  . Diabetes Mother   . Hypertension Mother   . Heart failure Mother     History  Substance Use Topics  . Smoking status: Current Every Day Smoker -- 0.50 packs/day for 24 years    Types: Cigarettes  . Smokeless tobacco: Not on file  . Alcohol Use: 1.8 oz/week    1 Cans of beer, 2 Shots of liquor per week     Comment: rare    OB History   Grav Para Term Preterm Abortions TAB SAB Ect Mult Living                  Review of Systems All other systems negative except as documented in the HPI. All pertinent positives and negatives as reviewed in the HPI.  Allergies  Review of patient's allergies indicates no known allergies.  Home Medications   Current Outpatient Rx  Name  Route  Sig  Dispense  Refill  . acetaminophen (TYLENOL) 500 MG tablet   Oral   Take 1,000 mg by mouth every 6 (six) hours as needed. For pain          . amLODipine (NORVASC) 5 MG tablet   Oral   Take 5 mg by mouth daily.         Marland Kitchen etonogestrel (IMPLANON) 68 MG IMPL implant   Subcutaneous   Inject 1 each into the skin once.           BP 167/108  Pulse 89  Temp(Src) 99.4 F (37.4 C) (Oral)  Resp 18  SpO2 100%  Physical Exam  Nursing note and vitals reviewed. Constitutional: She is oriented to person, place, and time. She appears well-developed and well-nourished. She appears distressed.  HENT:  Head: Normocephalic and atraumatic.  Eyes: Pupils are equal, round, and reactive to light.  Neck: Normal range of motion. Neck supple.  Pulmonary/Chest: Effort normal.  Musculoskeletal:       Right ankle: She exhibits swelling and ecchymosis. She exhibits normal range of motion, no deformity, no laceration and normal pulse. Tenderness. Lateral malleolus and medial malleolus tenderness found. Achilles tendon normal.  Neurological: She is alert and oriented to person, place, and time.  Skin: Skin is warm and dry.    ED Course  Procedures (including critical care time)  Labs Reviewed - No data to display Dg Ankle Complete Right  04/19/2012  *RADIOLOGY REPORT*  Clinical Data: Pain post fall.  RIGHT ANKLE - COMPLETE 3+ VIEW  Comparison: 07/29/2011  Findings: Distal aspect of the tibial IM rod with two distal interlocking screws, stable position.  The oblique distal diaphyseal tibial fracture seen on the previous study shows solid bony bridging.  Stable alignment.  Ankle mortise intact.  Calcaneal spur at the plantar aponeurosis.  No acute fracture.  No other significant osseous degenerative change.  IMPRESSION:  1.  Negative for fracture or other acute bony abnormality. 2.  Interval healing of distal tibial shaft fracture post internal fixation.   Original Report Authenticated By: D. Andria Rhein, MD    The patient is referred back to Ortho. Told to ice and elevate her ankle. Told to return here as needed.    MDM           Carlyle Dolly, PA-C 04/22/12 1529

## 2012-04-19 NOTE — ED Notes (Signed)
Ortho contacted to place splint.

## 2012-04-19 NOTE — ED Notes (Addendum)
C/o pain and swelling to right ankle after tripping yesterday.

## 2012-04-19 NOTE — ED Notes (Signed)
Ortho at bedside to apply splint.

## 2012-04-22 NOTE — ED Provider Notes (Signed)
  Medical screening examination/treatment/procedure(s) were performed by non-physician practitioner and as supervising physician I was immediately available for consultation/collaboration.    Esta Carmon, MD 04/22/12 1544 

## 2012-11-16 ENCOUNTER — Encounter (HOSPITAL_BASED_OUTPATIENT_CLINIC_OR_DEPARTMENT_OTHER): Payer: Self-pay | Admitting: *Deleted

## 2012-11-19 ENCOUNTER — Encounter (HOSPITAL_BASED_OUTPATIENT_CLINIC_OR_DEPARTMENT_OTHER): Payer: Self-pay | Admitting: Anesthesiology

## 2012-11-19 ENCOUNTER — Ambulatory Visit (HOSPITAL_BASED_OUTPATIENT_CLINIC_OR_DEPARTMENT_OTHER): Payer: Medicaid Other | Admitting: Anesthesiology

## 2012-11-19 ENCOUNTER — Encounter (HOSPITAL_BASED_OUTPATIENT_CLINIC_OR_DEPARTMENT_OTHER)
Admission: RE | Disposition: A | Discharge status: Home / Self Care | Payer: Self-pay | Source: Ambulatory Visit | Attending: Orthopedic Surgery

## 2012-11-19 ENCOUNTER — Ambulatory Visit (HOSPITAL_BASED_OUTPATIENT_CLINIC_OR_DEPARTMENT_OTHER)
Admission: RE | Admit: 2012-11-19 | Discharge: 2012-11-19 | Disposition: A | Discharge status: Home / Self Care | Payer: Medicaid Other | Source: Ambulatory Visit | Attending: Orthopedic Surgery | Admitting: Orthopedic Surgery

## 2012-11-19 ENCOUNTER — Encounter (HOSPITAL_BASED_OUTPATIENT_CLINIC_OR_DEPARTMENT_OTHER): Payer: Medicaid Other | Admitting: Anesthesiology

## 2012-11-19 ENCOUNTER — Ambulatory Visit (HOSPITAL_COMMUNITY): Payer: Medicaid Other

## 2012-11-19 DIAGNOSIS — D649 Anemia, unspecified: Secondary | ICD-10-CM | POA: Insufficient documentation

## 2012-11-19 DIAGNOSIS — Z79899 Other long term (current) drug therapy: Secondary | ICD-10-CM | POA: Insufficient documentation

## 2012-11-19 DIAGNOSIS — Y831 Surgical operation with implant of artificial internal device as the cause of abnormal reaction of the patient, or of later complication, without mention of misadventure at the time of the procedure: Secondary | ICD-10-CM | POA: Insufficient documentation

## 2012-11-19 DIAGNOSIS — T8489XA Other specified complication of internal orthopedic prosthetic devices, implants and grafts, initial encounter: Secondary | ICD-10-CM | POA: Insufficient documentation

## 2012-11-19 DIAGNOSIS — T8484XA Pain due to internal orthopedic prosthetic devices, implants and grafts, initial encounter: Secondary | ICD-10-CM

## 2012-11-19 DIAGNOSIS — F17200 Nicotine dependence, unspecified, uncomplicated: Secondary | ICD-10-CM | POA: Insufficient documentation

## 2012-11-19 DIAGNOSIS — M79609 Pain in unspecified limb: Secondary | ICD-10-CM | POA: Insufficient documentation

## 2012-11-19 HISTORY — DX: Pain due to internal orthopedic prosthetic devices, implants and grafts, initial encounter: T84.84XA

## 2012-11-19 HISTORY — DX: Presence of spectacles and contact lenses: Z97.3

## 2012-11-19 HISTORY — PX: HARDWARE REMOVAL: SHX979

## 2012-11-19 LAB — POCT HEMOGLOBIN-HEMACUE: Hemoglobin: 14.5 g/dL (ref 12.0–15.0)

## 2012-11-19 SURGERY — REMOVAL, HARDWARE
Anesthesia: General | Site: Leg Lower | Laterality: Right | Wound class: Clean

## 2012-11-19 MED ORDER — CEFAZOLIN SODIUM-DEXTROSE 2-3 GM-% IV SOLR
INTRAVENOUS | Status: AC
  Filled 2012-11-19: qty 50

## 2012-11-19 MED ORDER — PROMETHAZINE HCL 25 MG/ML IJ SOLN: 6.2500 mg | INTRAMUSCULAR | Status: DC | PRN

## 2012-11-19 MED ORDER — MIDAZOLAM HCL 2 MG/2ML IJ SOLN: 1.0000 mg | INTRAMUSCULAR | Status: DC | PRN

## 2012-11-19 MED ORDER — BUPIVACAINE HCL (PF) 0.5 % IJ SOLN
INTRAMUSCULAR | Status: AC
  Filled 2012-11-19: qty 30

## 2012-11-19 MED ORDER — HYDROMORPHONE HCL PF 1 MG/ML IJ SOLN
INTRAMUSCULAR | Status: AC
  Filled 2012-11-19: qty 1

## 2012-11-19 MED ORDER — FENTANYL CITRATE 0.05 MG/ML IJ SOLN
INTRAMUSCULAR | Status: AC
  Filled 2012-11-19: qty 4

## 2012-11-19 MED ORDER — OXYCODONE HCL 5 MG PO TABS
ORAL_TABLET | ORAL | Status: AC
  Filled 2012-11-19: qty 1

## 2012-11-19 MED ORDER — PROPOFOL 10 MG/ML IV BOLUS
INTRAVENOUS | Status: DC | PRN
  Administered 2012-11-19: 200 mg via INTRAVENOUS

## 2012-11-19 MED ORDER — FENTANYL CITRATE 0.05 MG/ML IJ SOLN: 50.0000 ug | Freq: Once | INTRAMUSCULAR | Status: DC

## 2012-11-19 MED ORDER — BUPIVACAINE HCL (PF) 0.5 % IJ SOLN
INTRAMUSCULAR | Status: DC | PRN
  Administered 2012-11-19: 20 mL

## 2012-11-19 MED ORDER — MIDAZOLAM HCL 5 MG/5ML IJ SOLN
INTRAMUSCULAR | Status: DC | PRN
  Administered 2012-11-19: 2 mg via INTRAVENOUS

## 2012-11-19 MED ORDER — DEXAMETHASONE SODIUM PHOSPHATE 10 MG/ML IJ SOLN
INTRAMUSCULAR | Status: DC | PRN
  Administered 2012-11-19: 10 mg via INTRAVENOUS

## 2012-11-19 MED ORDER — FENTANYL CITRATE 0.05 MG/ML IJ SOLN
INTRAMUSCULAR | Status: DC | PRN
  Administered 2012-11-19: 100 ug via INTRAVENOUS
  Administered 2012-11-19: 50 ug via INTRAVENOUS

## 2012-11-19 MED ORDER — LACTATED RINGERS IV SOLN
INTRAVENOUS | Status: DC
  Administered 2012-11-19 (×2): via INTRAVENOUS

## 2012-11-19 MED ORDER — OXYCODONE HCL 5 MG/5ML PO SOLN: 5.0000 mg | Freq: Once | ORAL | Status: AC | PRN

## 2012-11-19 MED ORDER — OXYCODONE HCL 5 MG PO TABS
5.0000 mg | ORAL_TABLET | Freq: Once | ORAL | Status: AC | PRN
Start: 2012-11-19 — End: 2012-11-19
  Administered 2012-11-19: 5 mg via ORAL

## 2012-11-19 MED ORDER — CEFAZOLIN SODIUM-DEXTROSE 2-3 GM-% IV SOLR
2.0000 g | INTRAVENOUS | Status: AC
  Administered 2012-11-19: 2 g via INTRAVENOUS

## 2012-11-19 MED ORDER — HYDROMORPHONE HCL PF 1 MG/ML IJ SOLN
0.2500 mg | INTRAMUSCULAR | Status: DC | PRN
  Administered 2012-11-19 (×4): 0.5 mg via INTRAVENOUS

## 2012-11-19 MED ORDER — LIDOCAINE HCL (CARDIAC) 20 MG/ML IV SOLN
INTRAVENOUS | Status: DC | PRN
  Administered 2012-11-19: 100 mg via INTRAVENOUS

## 2012-11-19 MED ORDER — METHOCARBAMOL 500 MG PO TABS: 500.0000 mg | ORAL_TABLET | Freq: Four times a day (QID) | ORAL | Status: DC

## 2012-11-19 MED ORDER — OXYCODONE-ACETAMINOPHEN 10-325 MG PO TABS: 1.0000 | ORAL_TABLET | Freq: Four times a day (QID) | ORAL | Status: DC | PRN

## 2012-11-19 MED ORDER — MIDAZOLAM HCL 2 MG/2ML IJ SOLN
INTRAMUSCULAR | Status: AC
  Filled 2012-11-19: qty 2

## 2012-11-19 MED ORDER — PROPOFOL 10 MG/ML IV BOLUS
INTRAVENOUS | Status: AC
  Filled 2012-11-19: qty 20

## 2012-11-19 MED ORDER — ONDANSETRON HCL 4 MG/2ML IJ SOLN
INTRAMUSCULAR | Status: DC | PRN
  Administered 2012-11-19: 4 mg via INTRAVENOUS

## 2012-11-19 MED ORDER — FENTANYL CITRATE 0.05 MG/ML IJ SOLN: 50.0000 ug | INTRAMUSCULAR | Status: DC | PRN

## 2012-11-19 SURGICAL SUPPLY — 69 items
BAG DECANTER FOR FLEXI CONT (MISCELLANEOUS) IMPLANT
BANDAGE ELASTIC 4 VELCRO ST LF (GAUZE/BANDAGES/DRESSINGS) IMPLANT
BANDAGE ELASTIC 6 VELCRO ST LF (GAUZE/BANDAGES/DRESSINGS) ×1 IMPLANT
BANDAGE ESMARK 6X9 LF (GAUZE/BANDAGES/DRESSINGS) IMPLANT
BANDAGE GAUZE ELAST BULKY 4 IN (GAUZE/BANDAGES/DRESSINGS) ×2 IMPLANT
BLADE SURG 15 STRL LF DISP TIS (BLADE) ×1 IMPLANT
BLADE SURG 15 STRL SS (BLADE) ×4
BNDG CMPR 9X6 STRL LF SNTH (GAUZE/BANDAGES/DRESSINGS) ×1
BNDG ESMARK 6X9 LF (GAUZE/BANDAGES/DRESSINGS) ×2
CANISTER SUCT 1200ML W/VALVE (MISCELLANEOUS) ×1 IMPLANT
COVER TABLE BACK 60X90 (DRAPES) ×1 IMPLANT
CUFF TOURNIQUET SINGLE 18IN (TOURNIQUET CUFF) IMPLANT
CUFF TOURNIQUET SINGLE 34IN LL (TOURNIQUET CUFF) ×1 IMPLANT
DECANTER SPIKE VIAL GLASS SM (MISCELLANEOUS) IMPLANT
DRAPE C-ARM 42X72 X-RAY (DRAPES) ×1 IMPLANT
DRAPE C-ARMOR (DRAPES) ×1 IMPLANT
DRAPE EXTREMITY T 121X128X90 (DRAPE) ×1 IMPLANT
DRAPE INCISE IOBAN 66X45 STRL (DRAPES) ×1 IMPLANT
DRAPE OEC MINIVIEW 54X84 (DRAPES) IMPLANT
DRAPE U-SHAPE 47X51 STRL (DRAPES) ×2 IMPLANT
DRAPE U-SHAPE 76X120 STRL (DRAPES) IMPLANT
DRSG PAD ABDOMINAL 8X10 ST (GAUZE/BANDAGES/DRESSINGS) ×1 IMPLANT
DURAPREP 26ML APPLICATOR (WOUND CARE) ×2 IMPLANT
ELECT REM PT RETURN 9FT ADLT (ELECTROSURGICAL) ×2
ELECTRODE REM PT RTRN 9FT ADLT (ELECTROSURGICAL) ×1 IMPLANT
GAUZE XEROFORM 1X8 LF (GAUZE/BANDAGES/DRESSINGS) IMPLANT
GLOVE BIO SURGEON STRL SZ8 (GLOVE) ×2 IMPLANT
GLOVE BIOGEL PI IND STRL 7.0 (GLOVE) IMPLANT
GLOVE BIOGEL PI IND STRL 8 (GLOVE) ×2 IMPLANT
GLOVE BIOGEL PI INDICATOR 7.0 (GLOVE) ×3
GLOVE BIOGEL PI INDICATOR 8 (GLOVE) ×2
GLOVE ECLIPSE 6.5 STRL STRAW (GLOVE) ×4 IMPLANT
GLOVE ORTHO TXT STRL SZ7.5 (GLOVE) ×2 IMPLANT
GOWN BRE IMP PREV XXLGXLNG (GOWN DISPOSABLE) ×4 IMPLANT
IMMOBILIZER KNEE 24 THIGH 36 (MISCELLANEOUS) ×1 IMPLANT
IMMOBILIZER KNEE 24 UNIV (MISCELLANEOUS)
NDL SUT 6 .5 CRC .975X.05 MAYO (NEEDLE) IMPLANT
NEEDLE MAYO TAPER (NEEDLE)
NS IRRIG 1000ML POUR BTL (IV SOLUTION) ×2 IMPLANT
PACK ARTHROSCOPY DSU (CUSTOM PROCEDURE TRAY) ×2 IMPLANT
PACK BASIN DAY SURGERY FS (CUSTOM PROCEDURE TRAY) ×2 IMPLANT
PAD CAST 4YDX4 CTTN HI CHSV (CAST SUPPLIES) IMPLANT
PADDING CAST COTTON 4X4 STRL (CAST SUPPLIES)
PADDING CAST COTTON 6X4 STRL (CAST SUPPLIES) ×1 IMPLANT
PENCIL BUTTON HOLSTER BLD 10FT (ELECTRODE) ×2 IMPLANT
SHEET MEDIUM DRAPE 40X70 STRL (DRAPES) ×2 IMPLANT
SLEEVE SCD COMPRESS KNEE MED (MISCELLANEOUS) ×2 IMPLANT
SPONGE GAUZE 4X4 12PLY (GAUZE/BANDAGES/DRESSINGS) ×2 IMPLANT
SPONGE LAP 18X18 X RAY DECT (DISPOSABLE) ×1 IMPLANT
SPONGE LAP 4X18 X RAY DECT (DISPOSABLE) ×1 IMPLANT
STAPLER VISISTAT (STAPLE) IMPLANT
STAPLER VISISTAT 35W (STAPLE) IMPLANT
STOCKINETTE 6  STRL (DRAPES)
STOCKINETTE 6 STRL (DRAPES) IMPLANT
STOCKINETTE IMPERVIOUS LG (DRAPES) IMPLANT
SUCTION FRAZIER TIP 10 FR DISP (SUCTIONS) ×1 IMPLANT
SUT MNCRL AB 4-0 PS2 18 (SUTURE) ×1 IMPLANT
SUT VIC AB 0 CT1 27 (SUTURE) ×2
SUT VIC AB 0 CT1 27XBRD ANBCTR (SUTURE) IMPLANT
SUT VIC AB 0 SH 27 (SUTURE) IMPLANT
SUT VIC AB 2-0 SH 27 (SUTURE)
SUT VIC AB 2-0 SH 27XBRD (SUTURE) ×1 IMPLANT
SUT VICRYL 3-0 CR8 SH (SUTURE) ×2 IMPLANT
SYR BULB 3OZ (MISCELLANEOUS) ×2 IMPLANT
TOWEL OR 17X24 6PK STRL BLUE (TOWEL DISPOSABLE) ×2 IMPLANT
TOWEL OR NON WOVEN STRL DISP B (DISPOSABLE) ×2 IMPLANT
TUBE CONNECTING 20X1/4 (TUBING) IMPLANT
UNDERPAD 30X30 INCONTINENT (UNDERPADS AND DIAPERS) ×2 IMPLANT
YANKAUER SUCT BULB TIP NO VENT (SUCTIONS) ×1 IMPLANT

## 2012-11-19 NOTE — Transfer of Care (Signed)
Immediate Anesthesia Transfer of Care Note  Patient: Bonnie Kelly  Procedure(s) Performed: Procedure(s): HARDWARE REMOVAL RIGHT TIBIAL NAIL AND SCREWS (Right)  Patient Location: PACU  Anesthesia Type:General  Level of Consciousness: awake and alert   Airway & Oxygen Therapy: Patient Spontanous Breathing and Patient connected to face mask oxygen  Post-op Assessment: Report given to PACU RN and Post -op Vital signs reviewed and stable  Post vital signs: Reviewed and stable  Complications: No apparent anesthesia complications

## 2012-11-19 NOTE — Anesthesia Preprocedure Evaluation (Signed)
Anesthesia Evaluation  Patient identified by MRN, date of birth, ID band Patient awake    Reviewed: Allergy & Precautions, H&P , NPO status , Patient's Chart, lab work & pertinent test results  Airway Mallampati: II TM Distance: >3 FB Neck ROM: Full    Dental  (+) Loose, Chipped and Dental Advisory Given Multiple missing and broken off at gumline.  Detailed dental advisory given.:   Pulmonary COPDCurrent Smoker,  + rhonchi         Cardiovascular hypertension, Pt. on medications Rhythm:Regular Rate:Normal     Neuro/Psych negative neurological ROS     GI/Hepatic negative GI ROS, Neg liver ROS, ETOH last evening   Endo/Other  negative endocrine ROS  Renal/GU      Musculoskeletal   Abdominal (+) + obese,   Peds  Hematology negative hematology ROS (+)   Anesthesia Other Findings   Reproductive/Obstetrics                           Anesthesia Physical Anesthesia Plan  ASA: II  Anesthesia Plan: General   Post-op Pain Management:    Induction: Intravenous  Airway Management Planned: Oral ETT  Additional Equipment:   Intra-op Plan:   Post-operative Plan: Extubation in OR  Informed Consent: I have reviewed the patients History and Physical, chart, labs and discussed the procedure including the risks, benefits and alternatives for the proposed anesthesia with the patient or authorized representative who has indicated his/her understanding and acceptance.     Plan Discussed with: CRNA and Surgeon  Anesthesia Plan Comments:         Anesthesia Quick Evaluation

## 2012-11-19 NOTE — Op Note (Signed)
11/19/2012  11:44 AM  PATIENT:  Bonnie Kelly    PRE-OPERATIVE DIAGNOSIS:  Retained hardware, symptomatic, right tibia  POST-OPERATIVE DIAGNOSIS:  Same  PROCEDURE:  HARDWARE REMOVAL RIGHT TIBIAL NAIL AND SCREWS  SURGEON:  Eulas Post, MD  PHYSICIAN ASSISTANT: Janace Litten, OPA-C, present and scrubbed throughout the case, critical for completion in a timely fashion, and for retraction, instrumentation, and closure.  ANESTHESIA:   General  PREOPERATIVE INDICATIONS:  [name] is a  37 y.o. female who had a previous intramedullary nail for a distal tibia fracture, and complained of symptomatic hardware directly over the distal screw heads. She elected for surgical management.  She wanted the entire nail removed.  The risks benefits and alternatives were discussed with the patient preoperatively including but not limited to the risks of infection, bleeding, nerve injury, cardiopulmonary complications, the need for revision surgery, among others, and the patient was willing to proceed.  OPERATIVE IMPLANTS: I removed a Biomet tibial nail with one proximal interlocking bolts and 2 distal interlocking bolts.  OPERATIVE FINDINGS: The fracture site appeared to have healed well, and the screws and nail came out relatively easily.  OPERATIVE PROCEDURE: The patient was brought to the operating room and placed in the supine position. IV Ancef was given. General anesthesia was administered. The right lower extremity was prepped and draped in usual sterile fashion. Time out was performed. Incision was made over the proximal tibia, as well as over the previous incisions from the interlocking screws. 2 of the screws removed, leaving 1 distally, for rotational control until we engaged the nail with the extractor.  The C-arm to localize the position on the proximal tibia where the entry point of the nail was, and then debrided this with a rongeur and a curette, removing minimal bone, and then  placed a guidewire into the nail itself, confirming on AP and lateral views. I then used the conical extractor to clear a path, engage the threads of the nail, and then extracted the nail without significant difficulty.  I irrigated the wounds copiously, and repaired the tendon. I was fairly medial on the tendon, and actually just at the junction of the parapatellar tissue and the medial tendon. The skin incision however did appear fairly lateral.   I irrigated the wounds copiously, and repaired the tendon split, as well and the subcutaneous tissue with Vicryl with Monocryl and Steri-Strips and sterile gauze. She was awakened and returned back in stable and satisfactory condition. There were no complications and she tolerated the procedure well.

## 2012-11-19 NOTE — Anesthesia Postprocedure Evaluation (Signed)
  Anesthesia Post-op Note  Patient: Bonnie Kelly  Procedure(s) Performed: Procedure(s): HARDWARE REMOVAL RIGHT TIBIAL NAIL AND SCREWS (Right)  Patient Location: PACU  Anesthesia Type:General  Level of Consciousness: awake  Airway and Oxygen Therapy: Patient Spontanous Breathing  Post-op Pain: mild  Post-op Assessment: Post-op Vital signs reviewed, Patient's Cardiovascular Status Stable, Respiratory Function Stable, Patent Airway, No signs of Nausea or vomiting and Pain level controlled  Post-op Vital Signs: Reviewed and stable  Complications: No apparent anesthesia complications

## 2012-11-19 NOTE — H&P (Signed)
PREOPERATIVE H&P  Chief Complaint: Symptomatic hardware, right tibia  HPI: Bonnie Kelly is a 37 y.o. female who presents for preoperative history and physical with a diagnosis of symptomatic hardware right tibia.. Symptoms are rated as moderate to severe, and have been worsening.  This is significantly impairing activities of daily living.  She has elected for surgical management. She a previous tibia fracture and underwent intramedullary nailing about a year and a half ago. She complained of pain directly over the distal screw heads primarily, but one of the entire rod taken out.  Past Medical History  Diagnosis Date  . Anemia   . Anemia   . Closed fracture of right tibia and fibula 07/28/2011  . Wears glasses    Past Surgical History  Procedure Laterality Date  . Birth control implant in l arm    . Tibia im nail insertion  07/28/2011    Procedure: INTRAMEDULLARY (IM) NAIL TIBIAL;  Surgeon: Eulas Post, MD;  Location: [facility] OR;  Service: Orthopedics;  Laterality: Right;   History   Social History  . Marital Status: Widowed    Spouse Name: N/A    Number of Children: N/A  . Years of Education: N/A   Social History Main Topics  . Smoking status: Current Every Day Smoker -- 0.50 packs/day for 24 years    Types: Cigarettes  . Smokeless tobacco: None  . Alcohol Use: 1.8 oz/week    1 Cans of beer, 2 Shots of liquor per week     Comment: rare  . Drug Use: No  . Sexual Activity: Not Currently    Birth Control/ Protection: Implant   Other Topics Concern  . None   Social History Narrative  . None   Family History  Problem Relation Age of Onset  . Diabetes Mother   . Hypertension Mother   . Heart failure Mother    No Known Allergies Prior to Admission medications   Medication Sig Start Date End Date Taking? Authorizing Provider  amLODipine (NORVASC) 5 MG tablet Take 5 mg by mouth daily.   Yes Historical Provider, MD  etonogestrel (IMPLANON) 68 MG IMPL implant Inject 1  each into the skin once.   Yes Historical Provider, MD  HYDROcodone-acetaminophen (NORCO/VICODIN) 5-325 MG per tablet Take 1 tablet by mouth every 6 (six) hours as needed for pain. 04/19/12  Yes Jamesetta Orleans Lawyer, PA-C  ibuprofen (ADVIL,MOTRIN) 800 MG tablet Take 1 tablet (800 mg total) by mouth every 8 (eight) hours as needed for pain. 04/19/12  Yes Jamesetta Orleans Lawyer, PA-C     Positive ROS: All other systems have been reviewed and were otherwise negative with the exception of those mentioned in the HPI and as above.  Physical Exam: General: Alert, no acute distress Cardiovascular: No pedal edema Respiratory: No cyanosis, no use of accessory musculature GI: No organomegaly, abdomen is soft and non-tender Skin: No lesions in the area of chief complaint Neurologic: Sensation intact distally Psychiatric: Patient is competent for consent with normal mood and affect Lymphatic: No axillary or cervical lymphadenopathy  MUSCULOSKELETAL: Right leg has well-healed surgical wounds, and no significant pain over the fracture site, but positive pain over the distal interlocking screws.  Assessment: Symptomatic hardware, right tibia  Plan: Plan for Procedure(s): HARDWARE REMOVAL RIGHT TIBIAL NAIL   The risks benefits and alternatives were discussed with the patient including but not limited to the risks of nonoperative treatment, versus surgical intervention including infection, bleeding, nerve injury,  blood clots, cardiopulmonary complications, morbidity,  mortality, among others, and they were willing to proceed. We also discussed the risks for incomplete relief of symptoms, recurrent fracture, among others.  Eulas Post, MD Cell [phone]   11/19/2012 10:30 AM

## 2012-11-19 NOTE — Anesthesia Procedure Notes (Signed)
Procedure Name: LMA Insertion Date/Time: 11/19/2012 10:40 AM Performed by: Caren Macadam Pre-anesthesia Checklist: Patient identified, Emergency Drugs available, Suction available and Patient being monitored Patient Re-evaluated:Patient Re-evaluated prior to inductionOxygen Delivery Method: Circle System Utilized Preoxygenation: Pre-oxygenation with 100% oxygen Intubation Type: IV induction Ventilation: Mask ventilation without difficulty LMA: LMA inserted LMA Size: 4.0 Number of attempts: 1 Airway Equipment and Method: bite block Placement Confirmation: positive ETCO2 and breath sounds checked- equal and bilateral Tube secured with: Tape Dental Injury: Teeth and Oropharynx as per pre-operative assessment

## 2012-11-23 ENCOUNTER — Encounter (HOSPITAL_BASED_OUTPATIENT_CLINIC_OR_DEPARTMENT_OTHER): Payer: Self-pay | Admitting: Orthopedic Surgery

## 2014-07-16 ENCOUNTER — Encounter: Payer: Self-pay | Admitting: Emergency Medicine

## 2014-07-16 ENCOUNTER — Emergency Department
Admission: EM | Admit: 2014-07-16 | Discharge: 2014-07-16 | Disposition: A | Discharge status: Home / Self Care | Payer: Medicaid Other | Attending: Emergency Medicine | Admitting: Emergency Medicine

## 2014-07-16 DIAGNOSIS — K529 Noninfective gastroenteritis and colitis, unspecified: Secondary | ICD-10-CM | POA: Insufficient documentation

## 2014-07-16 DIAGNOSIS — Z79899 Other long term (current) drug therapy: Secondary | ICD-10-CM | POA: Insufficient documentation

## 2014-07-16 DIAGNOSIS — R6883 Chills (without fever): Secondary | ICD-10-CM | POA: Insufficient documentation

## 2014-07-16 DIAGNOSIS — R112 Nausea with vomiting, unspecified: Secondary | ICD-10-CM | POA: Diagnosis present

## 2014-07-16 DIAGNOSIS — Z720 Tobacco use: Secondary | ICD-10-CM | POA: Insufficient documentation

## 2014-07-16 DIAGNOSIS — I10 Essential (primary) hypertension: Secondary | ICD-10-CM | POA: Diagnosis not present

## 2014-07-16 LAB — URINALYSIS COMPLETE WITH MICROSCOPIC (ARMC ONLY)
BILIRUBIN URINE: NEGATIVE
Glucose, UA: NEGATIVE mg/dL
Leukocytes, UA: NEGATIVE
NITRITE: NEGATIVE
Protein, ur: 100 mg/dL — AB
SPECIFIC GRAVITY, URINE: 1.023 (ref 1.005–1.030)
pH: 6 (ref 5.0–8.0)

## 2014-07-16 LAB — CBC WITH DIFFERENTIAL/PLATELET
Basophils Absolute: 0 10*3/uL (ref 0–0.1)
Basophils Relative: 1 %
Eosinophils Absolute: 0 10*3/uL (ref 0–0.7)
Eosinophils Relative: 1 %
HEMATOCRIT: 45.8 % (ref 35.0–47.0)
HEMOGLOBIN: 15.2 g/dL (ref 12.0–16.0)
LYMPHS PCT: 15 %
Lymphs Abs: 1.3 10*3/uL (ref 1.0–3.6)
MCH: 30.1 pg (ref 26.0–34.0)
MCHC: 33.1 g/dL (ref 32.0–36.0)
MCV: 90.8 fL (ref 80.0–100.0)
MONOS PCT: 3 %
Monocytes Absolute: 0.3 10*3/uL (ref 0.2–0.9)
NEUTROS ABS: 7.2 10*3/uL — AB (ref 1.4–6.5)
Neutrophils Relative %: 80 %
Platelets: 289 10*3/uL (ref 150–440)
RBC: 5.05 MIL/uL (ref 3.80–5.20)
RDW: 12.9 % (ref 11.5–14.5)
WBC: 8.9 10*3/uL (ref 3.6–11.0)

## 2014-07-16 LAB — COMPREHENSIVE METABOLIC PANEL
ALBUMIN: 4 g/dL (ref 3.5–5.0)
ALT: 24 U/L (ref 14–54)
ANION GAP: 6 (ref 5–15)
AST: 27 U/L (ref 15–41)
Alkaline Phosphatase: 61 U/L (ref 38–126)
BUN: 9 mg/dL (ref 6–20)
CALCIUM: 9.1 mg/dL (ref 8.9–10.3)
CO2: 23 mmol/L (ref 22–32)
Chloride: 106 mmol/L (ref 101–111)
Creatinine, Ser: 0.95 mg/dL (ref 0.44–1.00)
GFR calc non Af Amer: >60 mL/min (ref >60)
GLUCOSE: 122 mg/dL — AB (ref 65–99)
POTASSIUM: 4.3 mmol/L (ref 3.5–5.1)
Sodium: 135 mmol/L (ref 135–145)
TOTAL PROTEIN: 8.3 g/dL — AB (ref 6.5–8.1)
Total Bilirubin: 0.3 mg/dL (ref 0.3–1.2)

## 2014-07-16 LAB — POCT PREGNANCY, URINE: Preg Test, Ur: NEGATIVE

## 2014-07-16 MED ORDER — AMLODIPINE BESYLATE 5 MG PO TABS
ORAL_TABLET | ORAL | Status: AC
Start: 2014-07-16 — End: 2014-07-16
  Administered 2014-07-16: 5 mg via ORAL
  Filled 2014-07-16: qty 1

## 2014-07-16 MED ORDER — PROMETHAZINE HCL 25 MG/ML IJ SOLN
25.0000 mg | Freq: Once | INTRAMUSCULAR | Status: AC
  Administered 2014-07-16: 25 mg via INTRAVENOUS

## 2014-07-16 MED ORDER — ONDANSETRON 8 MG PO TBDP
8.0000 mg | ORAL_TABLET | Freq: Once | ORAL | Status: AC
  Administered 2014-07-16: 8 mg via ORAL

## 2014-07-16 MED ORDER — AMLODIPINE BESYLATE 5 MG PO TABS
5.0000 mg | ORAL_TABLET | Freq: Once | ORAL | Status: AC
  Administered 2014-07-16: 5 mg via ORAL

## 2014-07-16 MED ORDER — PROMETHAZINE HCL 25 MG PO TABS
ORAL_TABLET | ORAL | Status: AC
  Administered 2014-07-16: 25 mg via ORAL
  Filled 2014-07-16: qty 1

## 2014-07-16 MED ORDER — ONDANSETRON 8 MG PO TBDP
ORAL_TABLET | ORAL | Status: AC
  Filled 2014-07-16: qty 1

## 2014-07-16 MED ORDER — ONDANSETRON 8 MG PO TBDP
ORAL_TABLET | ORAL | Status: AC
  Administered 2014-07-16: 12:00:00 8 mg via ORAL
  Filled 2014-07-16: qty 1

## 2014-07-16 MED ORDER — ONDANSETRON HCL 4 MG/2ML IJ SOLN
INTRAMUSCULAR | Status: AC
  Administered 2014-07-16: 4 mg via INTRAVENOUS
  Filled 2014-07-16: qty 2

## 2014-07-16 MED ORDER — PROMETHAZINE HCL 25 MG RE SUPP: 25.0000 mg | Freq: Four times a day (QID) | RECTAL | Status: DC | PRN

## 2014-07-16 MED ORDER — ONDANSETRON HCL 4 MG/2ML IJ SOLN
4.0000 mg | Freq: Once | INTRAMUSCULAR | Status: AC
  Administered 2014-07-16: 4 mg via INTRAVENOUS

## 2014-07-16 MED ORDER — SODIUM CHLORIDE 0.9 % IV BOLUS (SEPSIS)
1000.0000 mL | Freq: Once | INTRAVENOUS | Status: AC
  Administered 2014-07-16: 1000 mL via INTRAVENOUS

## 2014-07-16 MED ORDER — PROMETHAZINE HCL 25 MG PO TABS
25.0000 mg | ORAL_TABLET | Freq: Once | ORAL | Status: AC
  Administered 2014-07-16: 25 mg via ORAL

## 2014-07-16 MED ORDER — PROMETHAZINE HCL 25 MG/ML IJ SOLN
INTRAMUSCULAR | Status: AC
  Administered 2014-07-16: 25 mg via INTRAVENOUS
  Filled 2014-07-16: qty 1

## 2014-07-16 NOTE — ED Provider Notes (Signed)
[facility] Emergency Department Provider Note  ____________________________________________  Time seen: Approximately 3:22 PM  I have reviewed the triage vital signs and the nursing notes.   HISTORY  Chief Complaint Emesis   HPI Bonnie Kelly is a 39 y.o. female who presents to the emergency department for sudden onset of nausea and vomiting at 5:30 AM this morning.She denies abdominal pain or diarrhea.   Past Medical History  Diagnosis Date  . Anemia   . Anemia   . Closed fracture of right tibia and fibula 07/28/2011  . Wears glasses   . Painful orthopaedic hardware 11/19/2012  . Hypertension     Patient Active Problem List   Diagnosis Date Noted  . Painful orthopaedic hardware 11/19/2012  . Closed fracture of right tibia and fibula 07/28/2011    Past Surgical History  Procedure Laterality Date  . Birth control implant in l arm    . Tibia im nail insertion  07/28/2011    Procedure: INTRAMEDULLARY (IM) NAIL TIBIAL;  Surgeon: Eulas Post, MD;  Location: [facility] OR;  Service: Orthopedics;  Laterality: Right;  . Hardware removal Right 11/19/2012    Procedure: HARDWARE REMOVAL RIGHT TIBIAL NAIL AND SCREWS;  Surgeon: Eulas Post, MD;  Location: [facility];  Service: Orthopedics;  Laterality: Right;  . Dilation and curettage of uterus      1997    Current Outpatient Rx  Name  Route  Sig  Dispense  Refill  . amLODipine (NORVASC) 5 MG tablet   Oral   Take 5 mg by mouth daily.         Marland Kitchen etonogestrel (IMPLANON) 68 MG IMPL implant   Subcutaneous   Inject 1 each into the skin once.         Marland Kitchen ibuprofen (ADVIL,MOTRIN) 800 MG tablet   Oral   Take 1 tablet (800 mg total) by mouth every 8 (eight) hours as needed for pain.   21 tablet   0   . methocarbamol (ROBAXIN) 500 MG tablet   Oral   Take 1 tablet (500 mg total) by mouth 4 (four) times daily.   75 tablet   1   . oxyCODONE-acetaminophen (PERCOCET) 10-325 MG per  tablet   Oral   Take 1-2 tablets by mouth every 6 (six) hours as needed for pain. MAXIMUM TOTAL ACETAMINOPHEN DOSE IS 4000 MG PER DAY   75 tablet   0     Allergies Review of patient's allergies indicates no known allergies.  Family History  Problem Relation Age of Onset  . Diabetes Mother   . Hypertension Mother   . Heart failure Mother     Social History History  Substance Use Topics  . Smoking status: Current Every Day Smoker -- 0.50 packs/day for 24 years    Types: Cigarettes  . Smokeless tobacco: Not on file  . Alcohol Use: No    Review of Systems Constitutional: No fever, positive forchills Eyes: No visual changes. ENT: No sore throat. Cardiovascular: Denies chest pain. Respiratory: Denies shortness of breath. Gastrointestinal: No abdominal pain.  No nausea, no vomiting.  No diarrhea.  No constipation. Genitourinary: Negative for dysuria. Musculoskeletal: Negative for back pain. Skin: Negative for rash. Neurological: Negative for headaches, focal weakness or numbness.  10-point ROS otherwise negative.  ____________________________________________   PHYSICAL EXAM:  VITAL SIGNS: ED Triage Vitals  Enc Vitals Group     BP 07/16/14 1121 145/118 mmHg     Pulse Rate 07/16/14 1121 101  Resp 07/16/14 1121 20     Temp 07/16/14 1121 98.2 F (36.8 C)     Temp Source 07/16/14 1121 Oral     SpO2 07/16/14 1121 100 %     Weight 07/16/14 1121 260 lb (117.935 kg)     Height 07/16/14 1121  (1.803 m)     Head Cir --      Peak Flow --      Pain Score 07/16/14 1122 1     Pain Loc --      Pain Edu? --      Excl. in GC? --     Constitutional: Alert and oriented. Well appearing and in no acute distress. Eyes: Conjunctivae are normal. PERRL. EOMI. Head: Atraumatic. Nose: No congestion/rhinnorhea. Mouth/Throat: Mucous membranes are moist.  Oropharynx non-erythematous. Neck: No stridor.   Cardiovascular: Normal rate, regular rhythm. Grossly normal heart  sounds.  Good peripheral circulation. Respiratory: Normal respiratory effort.  No retractions. Lungs CTAB. Gastrointestinal: Soft and nontender. No distention. No abdominal bruits. No CVA tenderness. Musculoskeletal: No lower extremity tenderness nor edema.  No joint effusions. Neurologic:  Normal speech and language. No gross focal neurologic deficits are appreciated. Speech is normal. No gait instability. Skin:  Skin is warm, dry and intact. No rash noted. Psychiatric: Mood and affect are normal. Speech and behavior are normal.  ____________________________________________   LABS (all labs ordered are listed, but only abnormal results are displayed)  Labs Reviewed  CBC WITH DIFFERENTIAL/PLATELET - Abnormal; Notable for the following:    Neutro Abs 7.2 (*)    All other components within normal limits  COMPREHENSIVE METABOLIC PANEL - Abnormal; Notable for the following:    Glucose, Bld 122 (*)    Total Protein 8.3 (*)    All other components within normal limits  URINALYSIS COMPLETEWITH MICROSCOPIC ([facility] ONLY) - Abnormal; Notable for the following:    Color, Urine YELLOW (*)    APPearance HAZY (*)    Ketones, ur TRACE (*)    Hgb urine dipstick 3+ (*)    Protein, ur 100 (*)    Bacteria, UA RARE (*)    Squamous Epithelial / LPF 0-5 (*)    All other components within normal limits  LIPASE, BLOOD  POC URINE PREG, ED  POCT PREGNANCY, URINE   ____________________________________________  EKG   ____________________________________________  RADIOLOGY  Not indicated ____________________________________________   PROCEDURES  Procedure(s) performed: None  Critical Care performed: No  ____________________________________________   INITIAL IMPRESSION / ASSESSMENT AND PLAN / ED COURSE  Pertinent labs & imaging results that were available during my care of the patient were reviewed by me and considered in my medical decision making (see chart for details).  Initial  Impression: Viral gastroenteritis. Will hydrate and medicate for nausea/vomiting and monitor.  4:25PM Again complaining of nausea. Phenergan order given to RN. Will monitor for effects. IV NS continues to infuse.  5:35 PM patient continues to complain of severe nausea, and dry heaving. IV Phenergan ordered.  ----------------------------------------- 7:10 PM on 07/16/2014 -----------------------------------------  Nausea relief. No further dry heaves or vomiting. Patient's ready for discharge. She was advised to follow-up with her primary care provider. She was advised to stay on a clear liquid diet today and progress to bland foods as tolerated. ____________________________________________   FINAL CLINICAL IMPRESSION(S) / ED DIAGNOSES  Final diagnoses:  None      Chinita Pester, FNP 07/16/14 1911  Emily Filbert, MD 07/17/14 [phone]

## 2014-07-16 NOTE — ED Notes (Signed)
Patient to front desk stating she vomited just after receiving Zofran ODT.

## 2014-07-16 NOTE — ED Notes (Addendum)
Patient reports vomiting approx every 10 minutes this am since 5am. States she has had similar issue in the past and had to be prescribed Phenergan suppository. Denies any blood in emesis.

## 2014-07-16 NOTE — ED Notes (Signed)
Pt reports not taking hypertension medication

## 2014-07-19 ENCOUNTER — Emergency Department (HOSPITAL_COMMUNITY)
Admission: EM | Admit: 2014-07-19 | Discharge: 2014-07-19 | Disposition: A | Discharge status: Home / Self Care | Payer: Medicaid Other | Attending: Emergency Medicine | Admitting: Emergency Medicine

## 2014-07-19 ENCOUNTER — Encounter (HOSPITAL_COMMUNITY): Payer: Self-pay | Admitting: *Deleted

## 2014-07-19 DIAGNOSIS — Z3202 Encounter for pregnancy test, result negative: Secondary | ICD-10-CM | POA: Diagnosis not present

## 2014-07-19 DIAGNOSIS — R112 Nausea with vomiting, unspecified: Secondary | ICD-10-CM | POA: Diagnosis present

## 2014-07-19 DIAGNOSIS — Z79899 Other long term (current) drug therapy: Secondary | ICD-10-CM | POA: Insufficient documentation

## 2014-07-19 DIAGNOSIS — Z862 Personal history of diseases of the blood and blood-forming organs and certain disorders involving the immune mechanism: Secondary | ICD-10-CM | POA: Insufficient documentation

## 2014-07-19 DIAGNOSIS — R630 Anorexia: Secondary | ICD-10-CM | POA: Insufficient documentation

## 2014-07-19 DIAGNOSIS — I10 Essential (primary) hypertension: Secondary | ICD-10-CM | POA: Diagnosis not present

## 2014-07-19 DIAGNOSIS — Z720 Tobacco use: Secondary | ICD-10-CM | POA: Diagnosis not present

## 2014-07-19 DIAGNOSIS — Z8781 Personal history of (healed) traumatic fracture: Secondary | ICD-10-CM | POA: Diagnosis not present

## 2014-07-19 DIAGNOSIS — N12 Tubulo-interstitial nephritis, not specified as acute or chronic: Secondary | ICD-10-CM | POA: Diagnosis not present

## 2014-07-19 DIAGNOSIS — F419 Anxiety disorder, unspecified: Secondary | ICD-10-CM

## 2014-07-19 LAB — CBC WITH DIFFERENTIAL/PLATELET
Basophils Absolute: 0 10*3/uL (ref 0.0–0.1)
Basophils Relative: 0 % (ref 0–1)
Eosinophils Absolute: 0 10*3/uL (ref 0.0–0.7)
Eosinophils Relative: 0 % (ref 0–5)
HCT: 44.7 % (ref 36.0–46.0)
Hemoglobin: 15.6 g/dL — ABNORMAL HIGH (ref 12.0–15.0)
Lymphocytes Relative: 10 % — ABNORMAL LOW (ref 12–46)
Lymphs Abs: 1.5 10*3/uL (ref 0.7–4.0)
MCH: 31 pg (ref 26.0–34.0)
MCHC: 34.9 g/dL (ref 30.0–36.0)
MCV: 88.7 fL (ref 78.0–100.0)
Monocytes Absolute: 0.5 10*3/uL (ref 0.1–1.0)
Monocytes Relative: 3 % (ref 3–12)
Neutro Abs: 12.6 10*3/uL — ABNORMAL HIGH (ref 1.7–7.7)
Neutrophils Relative %: 87 % — ABNORMAL HIGH (ref 43–77)
Platelets: 251 10*3/uL (ref 150–400)
RBC: 5.04 MIL/uL (ref 3.87–5.11)
RDW: 12.5 % (ref 11.5–15.5)
WBC: 14.6 10*3/uL — ABNORMAL HIGH (ref 4.0–10.5)

## 2014-07-19 LAB — COMPREHENSIVE METABOLIC PANEL
ALT: 17 U/L (ref 14–54)
AST: 18 U/L (ref 15–41)
Albumin: 3.7 g/dL (ref 3.5–5.0)
Alkaline Phosphatase: 55 U/L (ref 38–126)
Anion gap: 12 (ref 5–15)
BUN: 15 mg/dL (ref 6–20)
CO2: 21 mmol/L — ABNORMAL LOW (ref 22–32)
Calcium: 8.9 mg/dL (ref 8.9–10.3)
Chloride: 104 mmol/L (ref 101–111)
Creatinine, Ser: 0.92 mg/dL (ref 0.44–1.00)
GFR calc Af Amer: >60 mL/min (ref >60)
GFR calc non Af Amer: >60 mL/min (ref >60)
Glucose, Bld: 108 mg/dL — ABNORMAL HIGH (ref 65–99)
Potassium: 4 mmol/L (ref 3.5–5.1)
Sodium: 137 mmol/L (ref 135–145)
Total Bilirubin: 0.5 mg/dL (ref 0.3–1.2)
Total Protein: 7.4 g/dL (ref 6.5–8.1)

## 2014-07-19 LAB — RAPID URINE DRUG SCREEN, HOSP PERFORMED
Amphetamines: NOT DETECTED
Barbiturates: NOT DETECTED
Benzodiazepines: NOT DETECTED
Cocaine: NOT DETECTED
Opiates: NOT DETECTED
Tetrahydrocannabinol: NOT DETECTED

## 2014-07-19 LAB — URINALYSIS, ROUTINE W REFLEX MICROSCOPIC
Glucose, UA: 100 mg/dL — AB
Nitrite: POSITIVE — AB
PH: 5 (ref 5.0–8.0)
Protein, ur: >300 mg/dL — AB
Specific Gravity, Urine: 1.025 (ref 1.005–1.030)
Urobilinogen, UA: 2 mg/dL — ABNORMAL HIGH (ref 0.0–1.0)

## 2014-07-19 LAB — URINE MICROSCOPIC-ADD ON

## 2014-07-19 LAB — LIPASE, BLOOD: Lipase: 24 U/L (ref 22–51)

## 2014-07-19 LAB — POC URINE PREG, ED: PREG TEST UR: NEGATIVE

## 2014-07-19 MED ORDER — SODIUM CHLORIDE 0.9 % IV BOLUS (SEPSIS)
1000.0000 mL | Freq: Once | INTRAVENOUS | Status: AC
  Administered 2014-07-19: 1000 mL via INTRAVENOUS

## 2014-07-19 MED ORDER — LORAZEPAM 1 MG PO TABS: 1.0000 mg | ORAL_TABLET | Freq: Two times a day (BID) | ORAL | Status: DC

## 2014-07-19 MED ORDER — LORAZEPAM 2 MG/ML IJ SOLN
1.0000 mg | Freq: Once | INTRAMUSCULAR | Status: AC
  Administered 2014-07-19: 1 mg via INTRAVENOUS
  Filled 2014-07-19: qty 1

## 2014-07-19 MED ORDER — DEXTROSE 5 % IV SOLN
1.0000 g | Freq: Once | INTRAVENOUS | Status: AC
  Administered 2014-07-19: 1 g via INTRAVENOUS
  Filled 2014-07-19: qty 10

## 2014-07-19 MED ORDER — AMLODIPINE BESYLATE 5 MG PO TABS: 5.0000 mg | ORAL_TABLET | Freq: Once | ORAL | Status: DC

## 2014-07-19 MED ORDER — PROMETHAZINE HCL 25 MG/ML IJ SOLN
25.0000 mg | Freq: Once | INTRAMUSCULAR | Status: AC
  Administered 2014-07-19: 25 mg via INTRAVENOUS
  Filled 2014-07-19: qty 1

## 2014-07-19 MED ORDER — METOCLOPRAMIDE HCL 5 MG/ML IJ SOLN
10.0000 mg | Freq: Once | INTRAMUSCULAR | Status: AC
  Administered 2014-07-19: 10 mg via INTRAVENOUS
  Filled 2014-07-19: qty 2

## 2014-07-19 MED ORDER — PROMETHAZINE HCL 25 MG PO TABS: 25.0000 mg | ORAL_TABLET | Freq: Four times a day (QID) | ORAL | Status: DC | PRN

## 2014-07-19 MED ORDER — CIPROFLOXACIN HCL 500 MG PO TABS: 500.0000 mg | ORAL_TABLET | Freq: Two times a day (BID) | ORAL | Status: DC

## 2014-07-19 MED ORDER — ONDANSETRON HCL 4 MG/2ML IJ SOLN
4.0000 mg | Freq: Once | INTRAMUSCULAR | Status: AC
Start: 2014-07-19 — End: 2014-07-19
  Administered 2014-07-19: 4 mg via INTRAVENOUS
  Filled 2014-07-19: qty 2

## 2014-07-19 NOTE — ED Notes (Signed)
Lab called - blood for CBC clotted. Will reorder.

## 2014-07-19 NOTE — ED Provider Notes (Signed)
CSN: [id number]     Arrival date & time 07/19/14  4462 History   First MD Initiated Contact with Patient 07/19/14 1134     Chief Complaint  Patient presents with  . Emesis     (Consider location/radiation/quality/duration/timing/severity/associated sxs/prior Treatment) HPI  Pt is a 39yo female with hx of anemia and HTN, presenting to ED with c/o persistent nausea and vomiting that started on Saturday, 07/16/14, was seen at [facility] and discharged home with phenergan suppositories.  States she was fine on Sunday, 6/19, was able to keep down soup and juice but this morning she has been vomiting every 10 minutes since 6AM.  Pt states she cannot keep anything down including her BP medication. Also states she used a phenergan suppository w/o relief.  Denies urinary or vaginal symptoms. Does report 1 episode of loose stool earlier this morning. No blood or mucous in stool. Denies abdominal pain. Abdominal surgical hx significant for dilation and curettage of uterus, no other abdominal surgeries. No sick contacts or recent travel. Hx of similar vomiting episodes about 2 years ago that lasted 26 hours. States symptoms resolved on their own, she has not had to f/u with a GI specialist.   Past Medical History  Diagnosis Date  . Anemia   . Anemia   . Closed fracture of right tibia and fibula 07/28/2011  . Wears glasses   . Painful orthopaedic hardware 11/19/2012  . Hypertension    Past Surgical History  Procedure Laterality Date  . Birth control implant in l arm    . Tibia im nail insertion  07/28/2011    Procedure: INTRAMEDULLARY (IM) NAIL TIBIAL;  Surgeon: Joshua P Landau, MD;  Location: [facility] OR;  Service: Orthopedics;  Laterality: Right;  . Hardware removal Right 11/19/2012    Procedure: HARDWARE REMOVAL RIGHT TIBIAL NAIL AND SCREWS;  Surgeon: Joshua P Landau, MD;  Location: [facility];  Service: Orthopedics;  Laterality: Right;  . Dilation and curettage of uterus      19 31   Family  History  Problem Relation Age of Onset  . Diabetes Mother   . Hypertension Mother   . Heart failure Mother    History  Substance Use Topics  . Smoking status: Current Every Day Smoker -- 0.50 packs/day for 24 years    Types: Cigarettes  . Smokeless tobacco: Not on file  . Alcohol Use: No   OB History    No data available     Review of Systems  Constitutional: Positive for appetite change. Negative for fever, chills, diaphoresis and fatigue.  HENT: Negative for congestion.   Respiratory: Negative for cough and shortness of breath.   Cardiovascular: Negative for chest pain, palpitations and leg swelling.  Gastrointestinal: Positive for nausea and vomiting. Negative for abdominal pain, diarrhea and constipation.  Genitourinary: Negative for dysuria, urgency, frequency, hematuria, flank pain, vaginal pain, menstrual problem and pelvic pain.  All other systems reviewed and are negative.     Allergies  Review of patient's allergies indicates no known allergies.  Home Medications   Prior to Admission medications   Medication Sig Start Date End Date Taking? Authorizing Provider  amLODipine (NORVASC) 5 MG tablet Take 5 mg by mouth daily.   Yes Historical Provider, MD  etonogestrel (IMPLANON) 68 MG IMPL implant Inject 1 each into the skin once.   Yes Historical Provider, MD  ibuprofen (ADVIL,MOTRIN) 800 MG tablet Take 1 tablet (800 mg total) by mouth every 8 (eight) hours as needed for pain. 04/19/12  Yes Charlestine Night, PA-C  promethazine (PHENERGAN) 25 MG suppository Place 1 suppository (25 mg total) rectally every 6 (six) hours as needed for nausea or vomiting. 07/16/14  Yes Cari B Triplett, FNP  ciprofloxacin (CIPRO) 500 MG tablet Take 1 tablet (500 mg total) by mouth 2 (two) times daily. One po bid x 7 days 07/19/14   Junius Finner, PA-C  LORazepam (ATIVAN) 1 MG tablet Take 1 tablet (1 mg total) by mouth 2 (two) times daily. 07/19/14   Junius Finner, PA-C  methocarbamol (ROBAXIN)  500 MG tablet Take 1 tablet (500 mg total) by mouth 4 (four) times daily. Patient not taking: Reported on 07/19/2014 11/19/12   Teryl Lucy, MD  oxyCODONE-acetaminophen (PERCOCET) 10-325 MG per tablet Take 1-2 tablets by mouth every 6 (six) hours as needed for pain. MAXIMUM TOTAL ACETAMINOPHEN DOSE IS 4000 MG PER DAY Patient not taking: Reported on 07/19/2014 11/19/12   Teryl Lucy, MD  promethazine (PHENERGAN) 25 MG tablet Take 1 tablet (25 mg total) by mouth every 6 (six) hours as needed for nausea or vomiting. 07/19/14   Junius Finner, PA-C   BP 161/99 mmHg  Pulse 89  Temp(Src) 98.5 F (36.9 C) (Oral)  Resp 16  Ht  (1.803 m)  Wt 260 lb (117.935 kg)  BMI 36.28 kg/m2  SpO2 98%  LMP 07/13/2014 Physical Exam  Constitutional: She appears well-developed and well-nourished. No distress.  HENT:  Head: Normocephalic and atraumatic.  Mouth/Throat: Oropharynx is clear and moist.  Eyes: Conjunctivae are normal. No scleral icterus.  Neck: Normal range of motion. Neck supple.  Cardiovascular: Normal rate, regular rhythm and normal heart sounds.   Pulmonary/Chest: Effort normal and breath sounds normal. No respiratory distress. She has no wheezes. She has no rales. She exhibits no tenderness.  Abdominal: Soft. Bowel sounds are normal. She exhibits no distension and no mass. There is no tenderness. There is no rebound and no guarding.  Musculoskeletal: Normal range of motion.  Neurological: She is alert.  Skin: Skin is warm and dry. She is not diaphoretic.  Nursing note and vitals reviewed.   ED Course  Procedures (including critical care time) Labs Review Labs Reviewed  URINALYSIS, ROUTINE W REFLEX MICROSCOPIC (NOT AT [facility]) - Abnormal; Notable for the following:    Color, Urine RED (*)    APPearance CLOUDY (*)    Glucose, UA 100 (*)    Hgb urine dipstick LARGE (*)    Bilirubin Urine SMALL (*)    Ketones, ur >80 (*)    Protein, ur >300 (*)    Urobilinogen, UA 2.0 (*)    Nitrite  POSITIVE (*)    Leukocytes, UA TRACE (*)    All other components within normal limits  CBC WITH DIFFERENTIAL/PLATELET - Abnormal; Notable for the following:    WBC 14.6 (*)    Hemoglobin 15.6 (*)    Neutrophils Relative % 87 (*)    Neutro Abs 12.6 (*)    Lymphocytes Relative 10 (*)    All other components within normal limits  URINE MICROSCOPIC-ADD ON - Abnormal; Notable for the following:    Squamous Epithelial / LPF FEW (*)    Bacteria, UA FEW (*)    All other components within normal limits  COMPREHENSIVE METABOLIC PANEL - Abnormal; Notable for the following:    CO2 21 (*)    Glucose, Bld 108 (*)    All other components within normal limits  URINE CULTURE  URINE RAPID DRUG SCREEN, HOSP PERFORMED  LIPASE, BLOOD  CBC WITH DIFFERENTIAL/PLATELET  POC URINE PREG, ED    Imaging Review No results found.   EKG Interpretation None      MDM   Final diagnoses:  Pyelonephritis  Non-intractable vomiting with nausea, vomiting of unspecified type  Anxiety    Pt is a 39yo female presenting to ED with c/o persistent nausea and vomiting with loose stool x1 this morning. Hx of same 3 days ago, was seen at [facility], dx with gastrenteritis.  Abdominal exam is benign.  Pt is hypertensive, c/w pt not taking her BP medications this morning due to vomiting.  12:28 PM Pt has been given IV fluids and zofran, can hear pt vomiting through closed exam door.  Discussed with pt tx phenergan IV, pt requested something for her nerves and would prefer to try ativan first as suppository phenergan did not help earlier.  Labs: leukocytosis with WBC of 14.6, up from 8.9 on 07/16/14.  Urine c/w dehydration as well as infection with positive nitrites. Trace leukocytes, will get urine culture.   UDS negative for marijuana.   Will tx for pyelonephritis.  Due to blood clotting, CMP and Lipase resulted later.  CMP and Lipase: unremarkable. Normal LFTs.  Not concerned for pancreatitis, cholecystitis,  appendicitis or SBO.  IV rocephin given as well as IV phenergan as zofran and ativan did not help relief nausea.   Pt was given IV reglan, which improved her nausea. Pt was able to keep down PO fluids in ED.  Pt states she feels comfortable being discharged home. Urine culture pending. Will discharge home with cipro.  Home care instructions provided. Rx: cipro, phenergan PO, and ativan as pt requested "anxiety medication" only provided 6 tabs. Advised to f/u with PCP for continued anxiety management. Return precautions provided. Pt and her mother verbalized understanding and agreement with tx plan.  Discussed pt with Dr. Micheline Maze throughout ED visit, agrees with assessment and tx plan.     Junius Finner, PA-C 07/19/14 1543  Toy Cookey, MD 07/20/14 1104

## 2014-07-19 NOTE — ED Notes (Signed)
Pt reports having n/v recently and seen at [facility], pt was feeling better and then onset again today of n/v. Denies diarrhea or abd pain.

## 2014-07-20 LAB — URINE CULTURE: Culture: 10000

## 2014-07-21 ENCOUNTER — Inpatient Hospital Stay (HOSPITAL_COMMUNITY)
Admission: EM | Admit: 2014-07-21 | Discharge: 2014-07-24 | DRG: 690 | Disposition: A | Discharge status: Home / Self Care | Payer: Medicaid Other | Attending: Internal Medicine | Admitting: Internal Medicine

## 2014-07-21 ENCOUNTER — Encounter (HOSPITAL_COMMUNITY): Payer: Self-pay

## 2014-07-21 ENCOUNTER — Observation Stay (HOSPITAL_COMMUNITY): Payer: Medicaid Other

## 2014-07-21 DIAGNOSIS — R7309 Other abnormal glucose: Secondary | ICD-10-CM | POA: Diagnosis present

## 2014-07-21 DIAGNOSIS — Z720 Tobacco use: Secondary | ICD-10-CM | POA: Diagnosis present

## 2014-07-21 DIAGNOSIS — Z79899 Other long term (current) drug therapy: Secondary | ICD-10-CM

## 2014-07-21 DIAGNOSIS — R7303 Prediabetes: Secondary | ICD-10-CM | POA: Diagnosis present

## 2014-07-21 DIAGNOSIS — Z833 Family history of diabetes mellitus: Secondary | ICD-10-CM

## 2014-07-21 DIAGNOSIS — N179 Acute kidney failure, unspecified: Secondary | ICD-10-CM | POA: Diagnosis not present

## 2014-07-21 DIAGNOSIS — E860 Dehydration: Secondary | ICD-10-CM | POA: Diagnosis present

## 2014-07-21 DIAGNOSIS — N12 Tubulo-interstitial nephritis, not specified as acute or chronic: Principal | ICD-10-CM | POA: Diagnosis present

## 2014-07-21 DIAGNOSIS — I10 Essential (primary) hypertension: Secondary | ICD-10-CM | POA: Diagnosis not present

## 2014-07-21 DIAGNOSIS — F419 Anxiety disorder, unspecified: Secondary | ICD-10-CM | POA: Diagnosis present

## 2014-07-21 DIAGNOSIS — E669 Obesity, unspecified: Secondary | ICD-10-CM | POA: Diagnosis present

## 2014-07-21 DIAGNOSIS — E876 Hypokalemia: Secondary | ICD-10-CM | POA: Diagnosis not present

## 2014-07-21 DIAGNOSIS — E872 Acidosis: Secondary | ICD-10-CM

## 2014-07-21 DIAGNOSIS — R1110 Vomiting, unspecified: Secondary | ICD-10-CM

## 2014-07-21 DIAGNOSIS — Z8249 Family history of ischemic heart disease and other diseases of the circulatory system: Secondary | ICD-10-CM

## 2014-07-21 DIAGNOSIS — F17210 Nicotine dependence, cigarettes, uncomplicated: Secondary | ICD-10-CM | POA: Diagnosis present

## 2014-07-21 DIAGNOSIS — Z6837 Body mass index (BMI) 37.0-37.9, adult: Secondary | ICD-10-CM

## 2014-07-21 DIAGNOSIS — R112 Nausea with vomiting, unspecified: Secondary | ICD-10-CM | POA: Diagnosis not present

## 2014-07-21 DIAGNOSIS — Z791 Long term (current) use of non-steroidal anti-inflammatories (NSAID): Secondary | ICD-10-CM

## 2014-07-21 DIAGNOSIS — K219 Gastro-esophageal reflux disease without esophagitis: Secondary | ICD-10-CM | POA: Diagnosis present

## 2014-07-21 HISTORY — DX: Anxiety disorder, unspecified: F41.9

## 2014-07-21 LAB — LIPID PANEL
Cholesterol: 173 mg/dL (ref 0–200)
HDL: 60 mg/dL (ref >40)
LDL CALC: 97 mg/dL (ref 0–99)
Total CHOL/HDL Ratio: 2.9 RATIO
Triglycerides: 82 mg/dL (ref <150)
VLDL: 16 mg/dL (ref 0–40)

## 2014-07-21 LAB — URINE MICROSCOPIC-ADD ON

## 2014-07-21 LAB — COMPREHENSIVE METABOLIC PANEL
ALT: 16 U/L (ref 14–54)
ANION GAP: 9 (ref 5–15)
AST: 26 U/L (ref 15–41)
Albumin: 3.5 g/dL (ref 3.5–5.0)
Alkaline Phosphatase: 50 U/L (ref 38–126)
BUN: 15 mg/dL (ref 6–20)
CALCIUM: 8.8 mg/dL — AB (ref 8.9–10.3)
CO2: 19 mmol/L — AB (ref 22–32)
CREATININE: 1.1 mg/dL — AB (ref 0.44–1.00)
Chloride: 108 mmol/L (ref 101–111)
GFR calc non Af Amer: >60 mL/min (ref >60)
GLUCOSE: 130 mg/dL — AB (ref 65–99)
Potassium: 3.6 mmol/L (ref 3.5–5.1)
Sodium: 136 mmol/L (ref 135–145)
TOTAL PROTEIN: 7.3 g/dL (ref 6.5–8.1)
Total Bilirubin: 0.5 mg/dL (ref 0.3–1.2)

## 2014-07-21 LAB — URINALYSIS, ROUTINE W REFLEX MICROSCOPIC
GLUCOSE, UA: 100 mg/dL — AB
Ketones, ur: 40 mg/dL — AB
Nitrite: POSITIVE — AB
PROTEIN: 100 mg/dL — AB
SPECIFIC GRAVITY, URINE: 1.014 (ref 1.005–1.030)
Urobilinogen, UA: 1 mg/dL (ref 0.0–1.0)
pH: 6 (ref 5.0–8.0)

## 2014-07-21 LAB — CBC WITH DIFFERENTIAL/PLATELET
BASOS PCT: 0 % (ref 0–1)
Basophils Absolute: 0 10*3/uL (ref 0.0–0.1)
EOS ABS: 0.1 10*3/uL (ref 0.0–0.7)
Eosinophils Relative: 1 % (ref 0–5)
HCT: 40.4 % (ref 36.0–46.0)
Hemoglobin: 14 g/dL (ref 12.0–15.0)
Lymphocytes Relative: 18 % (ref 12–46)
Lymphs Abs: 1.2 10*3/uL (ref 0.7–4.0)
MCH: 30.4 pg (ref 26.0–34.0)
MCHC: 34.7 g/dL (ref 30.0–36.0)
MCV: 87.6 fL (ref 78.0–100.0)
Monocytes Absolute: 0.2 10*3/uL (ref 0.1–1.0)
Monocytes Relative: 3 % (ref 3–12)
Neutro Abs: 5.2 10*3/uL (ref 1.7–7.7)
Neutrophils Relative %: 78 % — ABNORMAL HIGH (ref 43–77)
Platelets: 307 10*3/uL (ref 150–400)
RBC: 4.61 MIL/uL (ref 3.87–5.11)
RDW: 12.7 % (ref 11.5–15.5)
WBC: 6.6 10*3/uL (ref 4.0–10.5)

## 2014-07-21 LAB — PHOSPHORUS: Phosphorus: 2.3 mg/dL — ABNORMAL LOW (ref 2.5–4.6)

## 2014-07-21 LAB — WET PREP, GENITAL
Clue Cells Wet Prep HPF POC: NONE SEEN
Trich, Wet Prep: NONE SEEN
WBC, Wet Prep HPF POC: NONE SEEN
YEAST WET PREP: NONE SEEN

## 2014-07-21 LAB — MAGNESIUM: MAGNESIUM: 1.7 mg/dL (ref 1.7–2.4)

## 2014-07-21 LAB — MRSA PCR SCREENING: MRSA by PCR: NEGATIVE

## 2014-07-21 LAB — LIPASE, BLOOD: Lipase: 24 U/L (ref 22–51)

## 2014-07-21 MED ORDER — PNEUMOCOCCAL VAC POLYVALENT 25 MCG/0.5ML IJ INJ
0.5000 mL | INJECTION | INTRAMUSCULAR | Status: DC
  Filled 2014-07-21 (×2): qty 0.5

## 2014-07-21 MED ORDER — METOCLOPRAMIDE HCL 5 MG/ML IJ SOLN: 5.0000 mg | Freq: Four times a day (QID) | INTRAMUSCULAR | Status: DC | PRN

## 2014-07-21 MED ORDER — IOHEXOL 300 MG/ML  SOLN
25.0000 mL | Freq: Once | INTRAMUSCULAR | Status: AC | PRN
  Administered 2014-07-21: 25 mL via ORAL

## 2014-07-21 MED ORDER — SODIUM CHLORIDE 0.9 % IV BOLUS (SEPSIS)
1000.0000 mL | Freq: Once | INTRAVENOUS | Status: AC
  Administered 2014-07-21: 1000 mL via INTRAVENOUS

## 2014-07-21 MED ORDER — ONDANSETRON HCL 4 MG/2ML IJ SOLN
4.0000 mg | Freq: Once | INTRAMUSCULAR | Status: AC
  Administered 2014-07-21: 4 mg via INTRAVENOUS
  Filled 2014-07-21: qty 2

## 2014-07-21 MED ORDER — PENICILLIN G BENZATHINE 1200000 UNIT/2ML IM SUSP: 3.0000 10*6.[IU] | Freq: Once | INTRAMUSCULAR | Status: DC

## 2014-07-21 MED ORDER — ONDANSETRON HCL 4 MG/2ML IJ SOLN
4.0000 mg | Freq: Four times a day (QID) | INTRAMUSCULAR | Status: DC | PRN
  Administered 2014-07-22 – 2014-07-24 (×2): 4 mg via INTRAVENOUS
  Filled 2014-07-21 (×3): qty 2

## 2014-07-21 MED ORDER — PENICILLIN G POTASSIUM 5000000 UNITS IJ SOLR: 3.0000 10*6.[IU] | Freq: Once | INTRAVENOUS | Status: DC

## 2014-07-21 MED ORDER — PROMETHAZINE HCL 25 MG/ML IJ SOLN
12.5000 mg | Freq: Four times a day (QID) | INTRAMUSCULAR | Status: DC | PRN
  Administered 2014-07-21 – 2014-07-23 (×5): 12.5 mg via INTRAVENOUS
  Filled 2014-07-21 (×7): qty 1

## 2014-07-21 MED ORDER — CALCIUM CARBONATE ANTACID 500 MG PO CHEW
1.0000 | CHEWABLE_TABLET | Freq: Three times a day (TID) | ORAL | Status: DC | PRN
  Filled 2014-07-21: qty 3

## 2014-07-21 MED ORDER — LORAZEPAM 2 MG/ML IJ SOLN
0.5000 mg | Freq: Once | INTRAMUSCULAR | Status: AC
  Administered 2014-07-21: 0.5 mg via INTRAVENOUS
  Filled 2014-07-21: qty 1

## 2014-07-21 MED ORDER — SODIUM CHLORIDE 0.9 % IV SOLN
INTRAVENOUS | Status: DC
  Administered 2014-07-21: 15:00:00 via INTRAVENOUS

## 2014-07-21 MED ORDER — DEXTROSE 5 % IV SOLN: 3.0000 10*6.[IU] | INTRAVENOUS | Status: DC

## 2014-07-21 MED ORDER — PROMETHAZINE HCL 25 MG/ML IJ SOLN
25.0000 mg | Freq: Once | INTRAMUSCULAR | Status: AC
  Administered 2014-07-21: 25 mg via INTRAVENOUS
  Filled 2014-07-21: qty 1

## 2014-07-21 MED ORDER — ACETAMINOPHEN 80 MG PO CHEW
500.0000 mg | CHEWABLE_TABLET | Freq: Four times a day (QID) | ORAL | Status: DC | PRN
  Filled 2014-07-21: qty 7

## 2014-07-21 MED ORDER — ENOXAPARIN SODIUM 40 MG/0.4ML ~~LOC~~ SOLN
40.0000 mg | SUBCUTANEOUS | Status: DC
  Administered 2014-07-21: 40 mg via SUBCUTANEOUS
  Filled 2014-07-21 (×2): qty 0.4

## 2014-07-21 MED ORDER — BOOST / RESOURCE BREEZE PO LIQD
1.0000 | Freq: Three times a day (TID) | ORAL | Status: DC
  Administered 2014-07-22 – 2014-07-23 (×5): 1 via ORAL

## 2014-07-21 MED ORDER — METOCLOPRAMIDE HCL 5 MG/ML IJ SOLN
10.0000 mg | Freq: Four times a day (QID) | INTRAMUSCULAR | Status: DC | PRN
  Administered 2014-07-21 – 2014-07-23 (×3): 10 mg via INTRAVENOUS
  Filled 2014-07-21 (×5): qty 2

## 2014-07-21 MED ORDER — SODIUM CHLORIDE 0.9 % IV SOLN
INTRAVENOUS | Status: DC
  Administered 2014-07-21: 17:00:00 via INTRAVENOUS

## 2014-07-21 MED ORDER — SODIUM CHLORIDE 0.9 % IV SOLN
1.5000 g | Freq: Four times a day (QID) | INTRAVENOUS | Status: DC
  Administered 2014-07-21 – 2014-07-23 (×8): 1.5 g via INTRAVENOUS
  Filled 2014-07-21 (×13): qty 1.5

## 2014-07-21 MED ORDER — GI COCKTAIL ~~LOC~~
30.0000 mL | Freq: Once | ORAL | Status: AC | PRN
  Filled 2014-07-21: qty 30

## 2014-07-21 MED ORDER — LABETALOL HCL 5 MG/ML IV SOLN
10.0000 mg | INTRAVENOUS | Status: DC | PRN
  Administered 2014-07-22: 10 mg via INTRAVENOUS
  Filled 2014-07-21 (×3): qty 4

## 2014-07-21 NOTE — ED Notes (Signed)
Spoke with admitting, will place prn orders for BP control.

## 2014-07-21 NOTE — ED Provider Notes (Signed)
CSN: [id number]     Arrival date & time 07/21/14  1002 History   First MD Initiated Contact with Patient 07/21/14 1008     Chief Complaint  Patient presents with  . Nausea  . Emesis     (Consider location/radiation/quality/duration/timing/severity/associated sxs/prior Treatment) HPI    PCP: Margaretann Loveless, MD Blood pressure 145/110, pulse 121, temperature 98.2 F (36.8 C), temperature source Oral, resp. rate 20, last menstrual period 07/13/2014, SpO2 96 %.  Bonnie Kelly is a 39 y/o female w/ h/o HTN who presents with a 5 day h/o nausea and emesis. She was recently seen on Saturday when the vomiting began, at which point she was diagnosed with gastroenteritis and given zofran. Her vomiting persisted and she was again seen on Monday at which point she was diagnosed with pyelonephritis due to an elevated WBC count and UA showing leukocytes and nitrites, the culture has now grown group B strep, she was started on phenergan and ciprofloxacin. She has been taking the cipro as prescribed and reports feeling well enough yesterday that she did not need to take the phenergan and was able to keep down fluids, rice, and other bland foods. This morning she ate an english muffin and began to feel nauseous, she took her phenergan but began vomiting shortly after, at which point she returned to the ED. She states that about 2wks ago she had RUQ pain that radiated to her back and since then she has had a loss of appetite, low energy, and severe GERD, which she has only ever had when she was pregnant 20 years ago. She has not had any diarrhea, her last bowel movement was 3 days ago, this is her normal pattern. She has no h/o abdominal surgeries and today denies abdominal pain, dysuria, frequency, or urgency. She says she has felt chills and reports a temp of 99.72F last night.    Past Medical History  Diagnosis Date  . Anemia   . Anemia   . Closed fracture of right tibia and fibula 07/28/2011  . Wears glasses   .  Painful orthopaedic hardware 11/19/2012  . Hypertension    Past Surgical History  Procedure Laterality Date  . Birth control implant in l arm    . Tibia im nail insertion  07/28/2011    Procedure: INTRAMEDULLARY (IM) NAIL TIBIAL;  Surgeon: Eulas Post, MD;  Location: [facility] OR;  Service: Orthopedics;  Laterality: Right;  . Hardware removal Right 11/19/2012    Procedure: HARDWARE REMOVAL RIGHT TIBIAL NAIL AND SCREWS;  Surgeon: Eulas Post, MD;  Location: [facility];  Service: Orthopedics;  Laterality: Right;  . Dilation and curettage of uterus      1997   Family History  Problem Relation Age of Onset  . Diabetes Mother   . Hypertension Mother   . Heart failure Mother    History  Substance Use Topics  . Smoking status: Current Every Day Smoker -- 0.50 packs/day for 24 years    Types: Cigarettes  . Smokeless tobacco: Not on file  . Alcohol Use: No   OB History    No data available     Review of Systems  All other systems reviewed and are negative.   10 Systems reviewed and are negative for acute change except as noted in the HPI.   Allergies  Review of patient's allergies indicates no known allergies.  Home Medications   Prior to Admission medications   Medication Sig Start Date End Date Taking? Authorizing Provider  amLODipine (NORVASC) 5 MG tablet Take 5 mg by mouth daily.    Historical Provider, MD  ciprofloxacin (CIPRO) 500 MG tablet Take 1 tablet (500 mg total) by mouth 2 (two) times daily. One po bid x 7 days 07/19/14   Junius Finner, PA-C  etonogestrel (IMPLANON) 68 MG IMPL implant Inject 1 each into the skin once.    Historical Provider, MD  ibuprofen (ADVIL,MOTRIN) 800 MG tablet Take 1 tablet (800 mg total) by mouth every 8 (eight) hours as needed for pain. 04/19/12   Christopher Lawyer, PA-C  LORazepam (ATIVAN) 1 MG tablet Take 1 tablet (1 mg total) by mouth 2 (two) times daily. 07/19/14   Junius Finner, PA-C  methocarbamol (ROBAXIN) 500 MG tablet  Take 1 tablet (500 mg total) by mouth 4 (four) times daily. Patient not taking: Reported on 07/19/2014 11/19/12   Teryl Lucy, MD  oxyCODONE-acetaminophen (PERCOCET) 10-325 MG per tablet Take 1-2 tablets by mouth every 6 (six) hours as needed for pain. MAXIMUM TOTAL ACETAMINOPHEN DOSE IS 4000 MG PER DAY Patient not taking: Reported on 07/19/2014 11/19/12   Teryl Lucy, MD  promethazine (PHENERGAN) 25 MG suppository Place 1 suppository (25 mg total) rectally every 6 (six) hours as needed for nausea or vomiting. 07/16/14   Chinita Pester, FNP  promethazine (PHENERGAN) 25 MG tablet Take 1 tablet (25 mg total) by mouth every 6 (six) hours as needed for nausea or vomiting. 07/19/14   Junius Finner, PA-C   BP 144/105 mmHg  Pulse 105  Temp(Src) 98.2 F (36.8 C) (Oral)  Resp 20  SpO2 100%  LMP 07/13/2014   Physical Exam  Constitutional: She appears well-developed and well-nourished. No distress.  HENT:  Head: Normocephalic and atraumatic.  Eyes: Pupils are equal, round, and reactive to light.  Neck: Normal range of motion. Neck supple.  Cardiovascular: Regular rhythm.  Tachycardia present.   Pulmonary/Chest: Effort normal.  Abdominal: Soft. Bowel sounds are normal. There is no tenderness. There is no rigidity, no rebound and no guarding.  Genitourinary: Uterus normal. Cervix exhibits no motion tenderness, no discharge and no friability. Right adnexum displays no mass and no tenderness. Left adnexum displays no mass and no tenderness. There is bleeding in the vagina. No foreign body around the vagina. No vaginal discharge found.  Neurological: She is alert.  Skin: Skin is warm and dry.  Nursing note and vitals reviewed.   ED Course  Procedures (including critical care time) Labs Review Labs Reviewed  URINALYSIS, ROUTINE W REFLEX MICROSCOPIC (NOT AT [facility]) - Abnormal; Notable for the following:    Color, Urine RED (*)    APPearance TURBID (*)    Glucose, UA 100 (*)    Hgb urine dipstick  LARGE (*)    Bilirubin Urine LARGE (*)    Ketones, ur 40 (*)    Protein, ur 100 (*)    Nitrite POSITIVE (*)    Leukocytes, UA LARGE (*)    All other components within normal limits  COMPREHENSIVE METABOLIC PANEL - Abnormal; Notable for the following:    CO2 19 (*)    Glucose, Bld 130 (*)    Creatinine, Ser 1.10 (*)    Calcium 8.8 (*)    All other components within normal limits  CBC WITH DIFFERENTIAL/PLATELET - Abnormal; Notable for the following:    Neutrophils Relative % 78 (*)    All other components within normal limits  URINE MICROSCOPIC-ADD ON - Abnormal; Notable for the following:    Squamous Epithelial / LPF MANY (*)  Bacteria, UA FEW (*)    All other components within normal limits  WET PREP, GENITAL  CULTURE, BLOOD (ROUTINE X 2)  CULTURE, BLOOD (ROUTINE X 2)  URINE CULTURE  LIPASE, BLOOD  GC/CHLAMYDIA PROBE AMP ([place]) NOT AT [facility]    Imaging Review No results found.   EKG Interpretation None      MDM   Final diagnoses:  Vomiting  Pyelonephritis    Pyelo which failed PO at home treatment of Cirpo and intractable vomiting, moderately improved with IV Ativan in the ED. Her urine culture from 2 days ago shows Group B strep in urine, 3 million units IV penicillin ordered. Wet prep is negative. WBC count has normalized.  Per Internal medicine residents Dr Isabella Bowens has agreed for admission. Dr. Dalphine Handing is the attending, blood cultures added on. They had agreed to admit, inpatient, Medsurg. Patient aware of diagnosis, treatment and plan-- agreeable and all questions answered.  Medications  sodium chloride 0.9 % bolus 1,000 mL (0 mLs Intravenous Stopped 07/21/14 1130)  sodium chloride 0.9 % bolus 1,000 mL (0 mLs Intravenous Stopped 07/21/14 1221)  ondansetron (ZOFRAN) injection 4 mg (4 mg Intravenous Given 07/21/14 1120)  promethazine (PHENERGAN) injection 25 mg (25 mg Intravenous Given 07/21/14 1136)  iohexol (OMNIPAQUE) 300 MG/ML solution 25 mL (25 mLs Oral  Contrast Given 07/21/14 1221)  LORazepam (ATIVAN) injection 0.5 mg (0.5 mg Intravenous Given 07/21/14 1236)  LORazepam (ATIVAN) injection 0.5 mg (0.5 mg Intravenous Given 07/21/14 1343)    Filed Vitals:   07/21/14 1330  BP: 144/105  Pulse: 105  Temp:   Resp: [address] PA-C 07/21/14 1420  Marlon Pel, PA-C 07/21/14 1428  Eber Hong, MD 07/23/14 [phone]

## 2014-07-21 NOTE — H&P (Signed)
Date: 07/21/2014               Patient Name:  Bonnie Kelly MRN: [id number]  DOB: 06/03/1975 Age / Sex: 39 y.o., female   PCP: Margaretann Loveless, MD         Medical Service: Internal Medicine Teaching Service         Attending Physician: Dr. Aletta Edouard, MD    First Contact: Dr. Isabella Bowens Pager: [phone]  Second Contact: Dr. Johna Roles Pager: [phone]       After Hours (After 5p/  First Contact Pager: [phone]  weekends / holidays): Second Contact Pager: [phone]   Chief Complaint: N/V  History of Present Illness: Ms. Bonnie Kelly is a 39 year old woman with history of hypertension, chronic anemia presenting with nausea and vomiting. She reports that started on Saturday morning. She went to [facility] ED. She was thought to have viral gastroenteritis. She discharged with Phenergan. She felt better on Sunday. Tuesday, her nausea and vomiting restarted. She was found to have a leukocytosis of 14.6. She was thought to have pyelonephritis. She is given IV Rocephin. She was given IV Reglan with improvement of her nausea. She was discharged with Cipro. She felt better yesterday. Today she began to have nausea and vomiting again. She denies sick contacts or abdominal surgeries. She reports she is currently on her menstrual period. She reports abdominal pain in her right upper quadrant radiating to back that started 2 weeks ago. It has been getting better. She's never had pain like this before. It is constant. No exacerbating or relieving factors. Denies fevers or chills, cough, shortness of breath, chest pain, diarrhea, dysuria, hematuria, edema, rash. Last bowel movement Monday.  Meds: Current Facility-Administered Medications  Medication Dose Route Frequency Provider Last Rate Last Dose  . penicillin g benzathine (BICILLIN LA) 1200000 UNIT/2ML injection 3 Million Units  3 Million Units Intramuscular Once Marlon Pel, PA-C       Current Outpatient Prescriptions  Medication Sig Dispense Refill  .  amLODipine (NORVASC) 5 MG tablet Take 5 mg by mouth daily.    . ciprofloxacin (CIPRO) 500 MG tablet Take 1 tablet (500 mg total) by mouth 2 (two) times daily. One po bid x 7 days 14 tablet 0  . etonogestrel (IMPLANON) 68 MG IMPL implant Inject 1 each into the skin once.    Marland Kitchen ibuprofen (ADVIL,MOTRIN) 800 MG tablet Take 1 tablet (800 mg total) by mouth every 8 (eight) hours as needed for pain. 21 tablet 0  . LORazepam (ATIVAN) 1 MG tablet Take 1 tablet (1 mg total) by mouth 2 (two) times daily. 6 tablet 0  . methocarbamol (ROBAXIN) 500 MG tablet Take 1 tablet (500 mg total) by mouth 4 (four) times daily. (Patient not taking: Reported on 07/19/2014) 75 tablet 1  . oxyCODONE-acetaminophen (PERCOCET) 10-325 MG per tablet Take 1-2 tablets by mouth every 6 (six) hours as needed for pain. MAXIMUM TOTAL ACETAMINOPHEN DOSE IS 4000 MG PER DAY (Patient not taking: Reported on 07/19/2014) 75 tablet 0  . promethazine (PHENERGAN) 25 MG suppository Place 1 suppository (25 mg total) rectally every 6 (six) hours as needed for nausea or vomiting. 12 each 0  . promethazine (PHENERGAN) 25 MG tablet Take 1 tablet (25 mg total) by mouth every 6 (six) hours as needed for nausea or vomiting. 10 tablet 0    Allergies: Allergies as of 07/21/2014  . (No Known Allergies)   Past Medical History  Diagnosis Date  . Anemia   .  Anemia   . Closed fracture of right tibia and fibula 07/28/2011  . Wears glasses   . Painful orthopaedic hardware 11/19/2012  . Hypertension    Past Surgical History  Procedure Laterality Date  . Birth control implant in l arm    . Tibia im nail insertion  07/28/2011    Procedure: INTRAMEDULLARY (IM) NAIL TIBIAL;  Surgeon: Eulas Post, MD;  Location: [facility] OR;  Service: Orthopedics;  Laterality: Right;  . Hardware removal Right 11/19/2012    Procedure: HARDWARE REMOVAL RIGHT TIBIAL NAIL AND SCREWS;  Surgeon: Eulas Post, MD;  Location: [facility];  Service: Orthopedics;   Laterality: Right;  . Dilation and curettage of uterus      1997   Family History  Problem Relation Age of Onset  . Diabetes Mother   . Hypertension Mother   . Heart failure Mother    History   Social History  . Marital Status: Widowed    Spouse Name: N/A  . Number of Children: N/A  . Years of Education: N/A   Occupational History  . Not on file.   Social History Main Topics  . Smoking status: Current Every Day Smoker -- 0.50 packs/day for 24 years    Types: Cigarettes  . Smokeless tobacco: Not on file  . Alcohol Use: No  . Drug Use: No  . Sexual Activity: Not Currently    Birth Control/ Protection: Implant   Other Topics Concern  . Not on file   Social History Narrative    Review of Systems: Constitutional: no fevers/chills Eyes: no vision changes Ears, nose, mouth, throat, and face: no cough Respiratory: no shortness of breath Cardiovascular: no chest pain Gastrointestinal: + nausea/vomiting, + abdominal pain, no constipation, no diarrhea Genitourinary: no dysuria, no hematuria Integument: no rash Hematologic/lymphatic: no bleeding/bruising, no edema Musculoskeletal: no arthralgias, + myalgias Neurological: no paresthesias, no weakness  Physical Exam: Blood pressure 144/105, pulse 105, temperature 98.2 F (36.8 C), temperature source Oral, resp. rate 20, last menstrual period 07/13/2014, SpO2 100 %. General Apperance: NAD Head: Normocephalic, atraumatic Eyes: PERRL, EOMI, anicteric sclera Ears: Normal external ear canal Nose: Nares normal, septum midline, mucosa normal Throat: Lips, mucosa and tongue normal  Neck: Supple, trachea midline Back: Left CVA tenderness Lungs: Clear to auscultation bilaterally. No wheezes, rhonchi or rales. Breathing comfortably Chest Wall: Nontender, no deformity Heart: Regular rate and rhythm, no murmur/rub/gallop Abdomen: Soft, nontender, nondistended, no rebound/guarding Extremities: Normal, atraumatic, warm and well  perfused, no edema Pulses: 2+ throughout Skin: No rashes or lesions Neurologic: Alert and oriented x 3. CNII-XII intact. Normal strength and sensation  Lab results: Basic Metabolic Panel:  Recent Labs  50/35/46 1332 07/21/14 1025  NA 137 136  K 4.0 3.6  CL 104 108  CO2 21* 19*  GLUCOSE 108* 130*  BUN 15 15  CREATININE 0.92 1.10*  CALCIUM 8.9 8.8*   Liver Function Tests:  Recent Labs  07/19/14 1332 07/21/14 1025  AST 18 26  ALT 17 16  ALKPHOS 55 50  BILITOT 0.5 0.5  PROT 7.4 7.3  ALBUMIN 3.7 3.5    Recent Labs  07/19/14 1332 07/21/14 1025  LIPASE 24 24   CBC:  Recent Labs  07/19/14 1249 07/21/14 1025  WBC 14.6* 6.6  NEUTROABS 12.6* 5.2  HGB 15.6* 14.0  HCT 44.7 40.4  MCV 88.7 87.6  PLT 251 307   Urine Drug Screen: Drugs of Abuse     Component Value Date/Time   LABOPIA NONE  DETECTED 07/19/2014 1219   COCAINSCRNUR NONE DETECTED 07/19/2014 1219   LABBENZ NONE DETECTED 07/19/2014 1219   AMPHETMU NONE DETECTED 07/19/2014 1219   THCU NONE DETECTED 07/19/2014 1219   LABBARB NONE DETECTED 07/19/2014 1219    Alcohol Level: No results for input(s): ETH in the last 72 hours. Urinalysis:  Recent Labs  07/19/14 1219 07/21/14 1230  COLORURINE RED* RED*  LABSPEC 1.025 1.014  PHURINE 5.0 6.0  GLUCOSEU 100* 100*  HGBUR LARGE* LARGE*  BILIRUBINUR SMALL* LARGE*  KETONESUR >80* 40*  PROTEINUR >300* 100*  UROBILINOGEN 2.0* 1.0  NITRITE POSITIVE* POSITIVE*  LEUKOCYTESUR TRACE* LARGE*   Assessment & Plan by Problem: Active Problems:   Pyelonephritis  Pyelonephritis: Left CVA tenderness. Nausea and vomiting likely secondary to pyelonephritis. Afebrile. Tachycardic with heart rate between 100-121. No leukocytosis. Urinalysis with large hemoglobin, positive nitrite, large leukocytes, few bacteria, 21-50 WBC. Urine culture from 07/19/2014 with 10,000 colonies/mL group B strep. Wet prep negative. -Follow-up blood cultures x2 -Follow-up repeat urine  culture -Follow-up GC/Chlamydia -Unasyn 3g every 6 hours for 14 day course of antibiotics. -Reglan  q6hr prn nausea, Zofran  q6hr prn nausea, phenergan 12.5mg  q6hr prn nausea  AKI: Creatinine on admission 1.1. Her baseline is around 0.9. Likely perirenal from dehydration secondary to nausea and vomiting. -NS@150  mL per hour  Metabolic acidosis: Bicarbonate 19 on admission. Anion gap 9. Likely 2/2 above problem. -NS@150  mL per hour -Repeat BMP in AM.  Hypertension: On amlodipine 5 mg daily at home. BP in ED 118/99-151/108. -Continue home amlodipine 5 mg daily. -IV labetalol  Q2 hr prn hypertension  FEN: CLD  VTE ppx: Lovenox  Dispo: Disposition is deferred at this time, awaiting improvement of current medical problems. Anticipated discharge in approximately 1-2 day(s).   The patient does have a current PCP (Neelam Milta Deiters, MD) and does not need an [facility] hospital follow-up appointment after discharge.  The patient does not know have transportation limitations that hinder transportation to clinic appointments.  Signed: Lora Paula, MD PGY-1 07/21/2014, 2:12 PM

## 2014-07-21 NOTE — ED Notes (Signed)
Pt ambulatory with steady gait to restroom 

## 2014-07-21 NOTE — ED Notes (Signed)
Pt seen yesterday in ED for nausea/vomiting, given PO phenergan to take at home. Pt reports no vomiting last night, but after taking phenergan & eating breakfast this morning began vomiting/dry heaving.

## 2014-07-21 NOTE — ED Notes (Signed)
PA student at bedside. Pt BP high at this time, 149/113, pt reports not being able to take bp medication this morning due to vomiting.

## 2014-07-21 NOTE — Progress Notes (Signed)
Received report from RN in ED.

## 2014-07-21 NOTE — Progress Notes (Addendum)
ANTIBIOTIC CONSULT NOTE - INITIAL  Pharmacy Consult for unasyn Indication: pyelonephritis  No Known Allergies  Patient Measurements:    Vital Signs: Temp: 98.2 F (36.8 C) (06/23 1009) Temp Source: Oral (06/23 1009) BP: 144/105 mmHg (06/23 1330) Pulse Rate: 105 (06/23 1330) Intake/Output from previous day:   Intake/Output from this shift:    Labs:  Recent Labs  07/19/14 1249 07/19/14 1332 07/21/14 1025  WBC 14.6*  --  6.6  HGB 15.6*  --  14.0  PLT 251  --  307  CREATININE  --  0.92 1.10*   Estimated Creatinine Clearance: 96.2 mL/min (by C-G formula based on Cr of 1.1). No results for input(s): VANCOTROUGH, VANCOPEAK, VANCORANDOM, GENTTROUGH, GENTPEAK, GENTRANDOM, TOBRATROUGH, TOBRAPEAK, TOBRARND, AMIKACINPEAK, AMIKACINTROU, AMIKACIN in the last 72 hours.   Microbiology: Recent Results (from the past 720 hour(s))  Urine culture     Status: None   Collection Time: 07/19/14 12:19 PM  Result Value Ref Range Status   Specimen Description URINE, RANDOM  Final   Special Requests NONE  Final   Culture   Final    10,000 COLONIES/mL GROUP B STREP(S.AGALACTIAE)ISOLATED TESTING AGAINST S. AGALACTIAE NOT ROUTINELY PERFORMED DUE TO PREDICTABILITY OF AMP/PEN/VAN SUSCEPTIBILITY.    Report Status 07/20/2014 FINAL  Final  Wet prep, genital     Status: None   Collection Time: 07/21/14  1:21 PM  Result Value Ref Range Status   Yeast Wet Prep HPF POC NONE SEEN NONE SEEN Final   Trich, Wet Prep NONE SEEN NONE SEEN Final   Clue Cells Wet Prep HPF POC NONE SEEN NONE SEEN Final   WBC, Wet Prep HPF POC NONE SEEN NONE SEEN Final    Medical History: Past Medical History  Diagnosis Date  . Anemia   . Anemia   . Closed fracture of right tibia and fibula 07/28/2011  . Wears glasses   . Painful orthopaedic hardware 11/19/2012  . Hypertension     Assessment: 38 yof to ED with N/V. Recently seen on Sat - diagnosed with gastroenteritis and given Zofran. Seen Mon and diagnosed with  pyelonephritis. Now growing Grp B strep in UC from 6/21. Afebrile, wbc wnl. SCr 1.1 on admit, normalized CrCl~77  6/23 unasyn>>  6/23 BCx2>> 6/23 UC>> 6/21 UC>> Grp B strep (no S - amp/pen S predictable) 6/23 wet prep: neg   Goal of Therapy:  Eradication of infection  Plan:  Unasyn 1.5g IV q6h  Monitor clinical progress, c/s, renal function, abx plan   Babs Bertin, PharmD Clinical Pharmacist - Resident Pager [phone] 07/21/2014 2:55 PM   6/24 AM: Pharmacy to sign off and follow for renal changes peripherally. Thank you. Okey Regal, PharmD

## 2014-07-21 NOTE — ED Notes (Signed)
Patient transported to Ultrasound 

## 2014-07-21 NOTE — ED Notes (Signed)
Pt has finished contrast. CT notified.  

## 2014-07-21 NOTE — Progress Notes (Signed)
NURSING PROGRESS NOTE  [name] [id number] Admission Data: 07/21/2014 5:37 PM Attending Provider: Aletta Edouard, MD [id number],MOLMBE S, MD Code Status: Full Allergies:  Review of patient's allergies indicates no known allergies. Past Medical History:   has a past medical history of Anemia; Anemia; Closed fracture of right tibia and fibula (07/28/2011); Wears glasses; Painful orthopaedic hardware (11/19/2012); and Hypertension. Past Surgical History:   has past surgical history that includes Birth Control Implant in L arm; Tibia IM nail insertion (07/28/2011); Hardware Removal (Right, 11/19/2012); and Dilation and curettage of uterus. Social History:   reports that she has been smoking Cigarettes.  She has a 12 pack-year smoking history. She does not have any smokeless tobacco history on file. She reports that she does not drink alcohol or use illicit drugs.  Bonnie Kelly is a 39 y.o. female patient admitted from ED:   Last Documented Vital Signs: Blood pressure 161/98, pulse 105, temperature 98.2 F (36.8 C), temperature source Oral, resp. rate 18, last menstrual period 07/13/2014, SpO2 100 %.  Cardiac Monitoring:  None ordered.   IV Fluids:  IV in place, occlusive dsg intact without redness, IV cath forearm right, condition patent and no redness normal saline.   Skin: WDL  Patient/Family orientated to room. Information packet given to patient/family. Admission inpatient armband information verified with patient/family to include name and date of birth and placed on patient arm. Side rails up x 2, fall assessment and education completed with patient/family. Patient/family able to verbalize understanding of risk associated with falls and verbalized understanding to call for assistance before getting out of bed. Call light within reach. Patient/family able to voice and demonstrate understanding of unit orientation instructions.    Will continue to evaluate and treat per MD  orders.   Leane Platt RN, BS, BSN

## 2014-07-21 NOTE — Progress Notes (Signed)
When report was received patient BP was elevated, patient was kept in ED until it was treated appropriately, whilst in Ed went to ultrasound per ED RN. Once back from ultrasound received call from ED RN @[phone]  telling me patient would be coming up.

## 2014-07-22 DIAGNOSIS — F17210 Nicotine dependence, cigarettes, uncomplicated: Secondary | ICD-10-CM | POA: Diagnosis present

## 2014-07-22 DIAGNOSIS — R112 Nausea with vomiting, unspecified: Secondary | ICD-10-CM | POA: Diagnosis not present

## 2014-07-22 DIAGNOSIS — E860 Dehydration: Secondary | ICD-10-CM | POA: Diagnosis present

## 2014-07-22 DIAGNOSIS — F419 Anxiety disorder, unspecified: Secondary | ICD-10-CM | POA: Diagnosis present

## 2014-07-22 DIAGNOSIS — R7303 Prediabetes: Secondary | ICD-10-CM | POA: Diagnosis present

## 2014-07-22 DIAGNOSIS — R7309 Other abnormal glucose: Secondary | ICD-10-CM

## 2014-07-22 DIAGNOSIS — Z8249 Family history of ischemic heart disease and other diseases of the circulatory system: Secondary | ICD-10-CM | POA: Diagnosis not present

## 2014-07-22 DIAGNOSIS — E669 Obesity, unspecified: Secondary | ICD-10-CM | POA: Diagnosis present

## 2014-07-22 DIAGNOSIS — Z79899 Other long term (current) drug therapy: Secondary | ICD-10-CM | POA: Diagnosis not present

## 2014-07-22 DIAGNOSIS — Z6837 Body mass index (BMI) 37.0-37.9, adult: Secondary | ICD-10-CM | POA: Diagnosis not present

## 2014-07-22 DIAGNOSIS — Z833 Family history of diabetes mellitus: Secondary | ICD-10-CM | POA: Diagnosis not present

## 2014-07-22 DIAGNOSIS — Z720 Tobacco use: Secondary | ICD-10-CM | POA: Diagnosis present

## 2014-07-22 DIAGNOSIS — Z791 Long term (current) use of non-steroidal anti-inflammatories (NSAID): Secondary | ICD-10-CM | POA: Diagnosis not present

## 2014-07-22 DIAGNOSIS — N12 Tubulo-interstitial nephritis, not specified as acute or chronic: Secondary | ICD-10-CM | POA: Diagnosis present

## 2014-07-22 DIAGNOSIS — I10 Essential (primary) hypertension: Secondary | ICD-10-CM | POA: Diagnosis present

## 2014-07-22 DIAGNOSIS — E872 Acidosis: Secondary | ICD-10-CM | POA: Diagnosis present

## 2014-07-22 DIAGNOSIS — R1110 Vomiting, unspecified: Secondary | ICD-10-CM | POA: Diagnosis not present

## 2014-07-22 DIAGNOSIS — N179 Acute kidney failure, unspecified: Secondary | ICD-10-CM | POA: Diagnosis present

## 2014-07-22 DIAGNOSIS — K219 Gastro-esophageal reflux disease without esophagitis: Secondary | ICD-10-CM | POA: Diagnosis present

## 2014-07-22 DIAGNOSIS — E876 Hypokalemia: Secondary | ICD-10-CM | POA: Diagnosis not present

## 2014-07-22 LAB — CBC
HEMATOCRIT: 39.7 % (ref 36.0–46.0)
HEMOGLOBIN: 13.6 g/dL (ref 12.0–15.0)
MCH: 30.1 pg (ref 26.0–34.0)
MCHC: 34.3 g/dL (ref 30.0–36.0)
MCV: 87.8 fL (ref 78.0–100.0)
Platelets: 275 10*3/uL (ref 150–400)
RBC: 4.52 MIL/uL (ref 3.87–5.11)
RDW: 12.6 % (ref 11.5–15.5)
WBC: 6.3 10*3/uL (ref 4.0–10.5)

## 2014-07-22 LAB — BASIC METABOLIC PANEL
Anion gap: 11 (ref 5–15)
BUN: 6 mg/dL (ref 6–20)
CHLORIDE: 106 mmol/L (ref 101–111)
CO2: 23 mmol/L (ref 22–32)
Calcium: 8.7 mg/dL — ABNORMAL LOW (ref 8.9–10.3)
Creatinine, Ser: 0.86 mg/dL (ref 0.44–1.00)
GLUCOSE: 107 mg/dL — AB (ref 65–99)
POTASSIUM: 3.6 mmol/L (ref 3.5–5.1)
Sodium: 140 mmol/L (ref 135–145)

## 2014-07-22 LAB — GC/CHLAMYDIA PROBE AMP (~~LOC~~) NOT AT ARMC
CHLAMYDIA, DNA PROBE: NEGATIVE
NEISSERIA GONORRHEA: NEGATIVE

## 2014-07-22 LAB — HEMOGLOBIN A1C
HEMOGLOBIN A1C: 5.8 % — AB (ref 4.8–5.6)
Mean Plasma Glucose: 120 mg/dL

## 2014-07-22 MED ORDER — AMLODIPINE BESYLATE 5 MG PO TABS
5.0000 mg | ORAL_TABLET | Freq: Every day | ORAL | Status: DC
  Administered 2014-07-22 – 2014-07-24 (×3): 5 mg via ORAL
  Filled 2014-07-22 (×3): qty 1

## 2014-07-22 MED ORDER — SENNOSIDES-DOCUSATE SODIUM 8.6-50 MG PO TABS
2.0000 | ORAL_TABLET | Freq: Once | ORAL | Status: DC
  Filled 2014-07-22: qty 2

## 2014-07-22 MED ORDER — LORAZEPAM 2 MG/ML IJ SOLN
1.0000 mg | Freq: Once | INTRAMUSCULAR | Status: AC
  Administered 2014-07-23: 1 mg via INTRAVENOUS
  Filled 2014-07-22: qty 1

## 2014-07-22 MED ORDER — ENOXAPARIN SODIUM 60 MG/0.6ML ~~LOC~~ SOLN
60.0000 mg | Freq: Every day | SUBCUTANEOUS | Status: DC
  Administered 2014-07-23 – 2014-07-24 (×2): 60 mg via SUBCUTANEOUS
  Filled 2014-07-22 (×3): qty 0.6

## 2014-07-22 MED ORDER — K PHOS MONO-SOD PHOS DI & MONO 155-852-130 MG PO TABS
500.0000 mg | ORAL_TABLET | Freq: Once | ORAL | Status: AC
  Administered 2014-07-22: 500 mg via ORAL
  Filled 2014-07-22: qty 2

## 2014-07-22 MED ORDER — MAGNESIUM CITRATE PO SOLN
0.5000 | Freq: Once | ORAL | Status: DC
  Filled 2014-07-22: qty 296

## 2014-07-22 NOTE — Progress Notes (Addendum)
Subjective: No acute events overnight. Reports she is still nauseated this morning. Unable to tolerate PO intake. Denies pain. +flatus. Last BM on Monday  Objective: Vital signs in last 24 hours: Filed Vitals:   07/21/14 2138 07/21/14 2139 07/22/14 0619 07/22/14 1000  BP: 143/79  134/99 140/98  Pulse: 99 95 102   Temp: 98 F (36.7 C) 98 F (36.7 C) 99.9 F (37.7 C)   TempSrc: Oral  Oral   Resp: 16  16   Height:      Weight:      SpO2: 100% 100% 98%    Weight change:   Intake/Output Summary (Last 24 hours) at 07/22/14 1044 Last data filed at 07/22/14 0902  Gross per 24 hour  Intake    270 ml  Output   2050 ml  Net  -1780 ml   General Apperance: NAD HEENT: Normocephalic, atraumatic, PERRL, EOMI, anicteric sclera Neck: Supple, trachea midline Lungs: Clear to auscultation bilaterally. No wheezes, rhonchi or rales. Breathing comfortably Heart: Regular rate and rhythm, no murmur/rub/gallop Abdomen: Soft, nontender, nondistended, no rebound/guarding Extremities: Normal, atraumatic, warm and well perfused, no edema Pulses: 2+ throughout Skin: No rashes or lesions Neurologic: Alert and oriented x 3. CNII-XII intact. Normal strength and sensation  Lab Results: Basic Metabolic Panel:  Recent Labs Lab 07/21/14 1025 07/21/14 1931 07/22/14 0559  NA 136  --  140  K 3.6  --  3.6  CL 108  --  106  CO2 19*  --  23  GLUCOSE 130*  --  107*  BUN 15  --  6  CREATININE 1.10*  --  0.86  CALCIUM 8.8*  --  8.7*  MG  --  1.7  --   PHOS  --  2.3*  --    Liver Function Tests:  Recent Labs Lab 07/19/14 1332 07/21/14 1025  AST 18 26  ALT 17 16  ALKPHOS 55 50  BILITOT 0.5 0.5  PROT 7.4 7.3  ALBUMIN 3.7 3.5    Recent Labs Lab 07/19/14 1332 07/21/14 1025  LIPASE 24 24   CBC:  Recent Labs Lab 07/19/14 1249 07/21/14 1025 07/22/14 0559  WBC 14.6* 6.6 6.3  NEUTROABS 12.6* 5.2  --   HGB 15.6* 14.0 13.6  HCT 44.7 40.4 39.7  MCV 88.7 87.6 87.8  PLT 251 307 275    Hemoglobin A1C:  Recent Labs Lab 07/21/14 1925  HGBA1C 5.8*   Fasting Lipid Panel:  Recent Labs Lab 07/21/14 1931  CHOL 173  HDL 60  LDLCALC 97  TRIG 82  CHOLHDL 2.9   Urine Drug Screen: Drugs of Abuse     Component Value Date/Time   LABOPIA NONE DETECTED 07/19/2014 1219   COCAINSCRNUR NONE DETECTED 07/19/2014 1219   LABBENZ NONE DETECTED 07/19/2014 1219   AMPHETMU NONE DETECTED 07/19/2014 1219   THCU NONE DETECTED 07/19/2014 1219   LABBARB NONE DETECTED 07/19/2014 1219    Alcohol Level: No results for input(s): ETH in the last 168 hours. Urinalysis:  Recent Labs Lab 07/19/14 1219 07/21/14 1230  COLORURINE RED* RED*  LABSPEC 1.025 1.014  PHURINE 5.0 6.0  GLUCOSEU 100* 100*  HGBUR LARGE* LARGE*  BILIRUBINUR SMALL* LARGE*  KETONESUR >80* 40*  PROTEINUR >300* 100*  UROBILINOGEN 2.0* 1.0  NITRITE POSITIVE* POSITIVE*  LEUKOCYTESUR TRACE* LARGE*   Micro Results: Recent Results (from the past 240 hour(s))  Urine culture     Status: None   Collection Time: 07/19/14 12:19 PM  Result Value Ref Range Status  Specimen Description URINE, RANDOM  Final   Special Requests NONE  Final   Culture   Final    10,000 COLONIES/mL GROUP B STREP(S.AGALACTIAE)ISOLATED TESTING AGAINST S. AGALACTIAE NOT ROUTINELY PERFORMED DUE TO PREDICTABILITY OF AMP/PEN/VAN SUSCEPTIBILITY.    Report Status 07/20/2014 FINAL  Final  Wet prep, genital     Status: None   Collection Time: 07/21/14  1:21 PM  Result Value Ref Range Status   Yeast Wet Prep HPF POC NONE SEEN NONE SEEN Final   Trich, Wet Prep NONE SEEN NONE SEEN Final   Clue Cells Wet Prep HPF POC NONE SEEN NONE SEEN Final   WBC, Wet Prep HPF POC NONE SEEN NONE SEEN Final  MRSA PCR Screening     Status: None   Collection Time: 07/21/14  6:45 PM  Result Value Ref Range Status   MRSA by PCR NEGATIVE NEGATIVE Final    Comment:        The GeneXpert MRSA Assay (FDA approved for NASAL specimens only), is one component of  a comprehensive MRSA colonization surveillance program. It is not intended to diagnose MRSA infection nor to guide or monitor treatment for MRSA infections.    Studies/Results: US Abdomen Complete  07/21/2014   CLINICAL DATA:  Vomiting.  EXAM: ULTRASOUND ABDOMEN COMPLETE  COMPARISON:  None.  FINDINGS: Gallbladder: No gallstones or wall thickening visualized. No sonographic Murphy sign noted.  Common bile duct: Diameter: Normal at 5 mm  Liver: Small anechoic lesion measuring 1.1 cm in the right hepatic lobe. No biliary duct dilatation. Normal echogenicity  IVC: No abnormality visualized.  Pancreas: Visualized portion unremarkable.  Spleen: Size and appearance within normal limits.  Right Kidney: Length: 11.5 cm. Echogenicity within normal limits. No mass or hydronephrosis visualized.  Left Kidney: Length: 11.1 cm. Echogenicity within normal limits. No mass or hydronephrosis visualized.  Abdominal aorta: No aneurysm visualized.  Other findings: None.  IMPRESSION:  WITH: IMPRESSION:  WITH 1. No acute findings by ultrasound. 2. Normal gallbladder and biliary tree. 3. Small hepatic cyst.   Electronically Signed   By: Genevive Bi M.D.   On: 07/21/2014 16:57   Medications: I have reviewed the patient's current medications. Scheduled Meds: . amLODipine  5 mg Oral Daily  . ampicillin-sulbactam (UNASYN) IV  1.5 g Intravenous Q6H  . enoxaparin (LOVENOX) injection  60 mg Subcutaneous Daily  . feeding supplement (RESOURCE BREEZE)  1 Container Oral TID BM  . magnesium citrate  0.5 Bottle Oral Once  . phosphorus  500 mg Oral Once  . pneumococcal 23 valent vaccine  0.5 mL Intramuscular Tomorrow-1000   Continuous Infusions:  PRN Meds:.acetaminophen, calcium carbonate, labetalol, metoCLOPramide (REGLAN) injection, ondansetron, promethazine  Assessment/Plan: Principal Problem:   Pyelonephritis Active Problems:   Prediabetes   Tobacco use  Pyelonephritis: CVA tenderness resolved. GC/Chlamydia  negative. -Follow-up blood cultures x2 -Follow-up repeat urine culture -Unasyn 3g every 6 hours for 14 day course of antibiotics. -Reglan  q6hr prn nausea, Zofran  q6hr prn nausea, phenergan 12.5mg  q6hr prn nausea  Nausea/Vomiting: Differential includes above problem, gastroenteritis, dyspepsia, constipation. Unlikely to be SBO as she is still passing flatus. No symptoms or risk factors for gastroparesis. Cholecystitis unlikely as US abdomen was unremarkable. Lipase normal making pancreatitis unlikely. LFTs normal making hepatitis unlikely.  -1/2 bottle magnesium citrate  AKI: Cr 0.86. Resolved. -D/c IV fluids  Metabolic acidosis: Bicarbonate 23. Resolved.  Hypertension: On amlodipine 5 mg daily at home. BP in ED 118/99-151/108. -Continue home amlodipine 5 mg daily. -IV labetalol   Q2 hr prn hypertension  Pre-diabetes: Hgb A1c 5.8% -Follow up with PCP  FEN: CLD  VTE ppx: Lovenox  Dispo: Disposition is deferred at this time, awaiting improvement of current medical problems.  Anticipated discharge in approximately 0-1 day(s).   The patient does have a current PCP [facility] Nuala Alpha, FNP) and does not need an [facility] hospital follow-up appointment after discharge.  The patient does not know have transportation limitations that hinder transportation to clinic appointments.  .Services Needed at time of discharge: Y = Yes, Blank = No PT:   OT:   RN:   Equipment:   Other:     LOS: 1 day   Lora Paula, MD 07/22/2014, 10:44 AM

## 2014-07-22 NOTE — Progress Notes (Signed)
Initial Nutrition Assessment  DOCUMENTATION CODES:  Obesity unspecified  INTERVENTION:  No nutrition intervention at this time --- patient declined  NUTRITION DIAGNOSIS:  Inadequate oral intake related to nausea as evidenced by per patient/family report  GOAL:  Patient will meet greater than or equal to 90% of their needs  MONITOR:  PO intake, Labs, Weight trends, I & O's  REASON FOR ASSESSMENT:  Malnutrition Screening Tool  ASSESSMENT: 39 year old Female with history of HTN, chronic anemia presented with nausea and vomiting; dx with pyelonephritis.  Patient reports a decreased appetite due to nausea.  Receiving Zofran PRN.  States she was eating fine until Saturday morning when she woke up vomiting.  Also endorses a 10 lb weight loss since this time.  RD offered oral nutrition supplements, however, pt declined.  Nutrition focused physical exam completed.  No muscle or subcutaneous fat depletion noticed.  Height:  Ht Readings from Last 1 Encounters:  07/21/14 5\' 11"  (1.803 m)    Weight:  Wt Readings from Last 1 Encounters:  07/21/14 266 lb 12.5 oz (121.01 kg)    Ideal Body Weight:  70 kg  Wt Readings from Last 10 Encounters:  07/21/14 266 lb 12.5 oz (121.01 kg)  07/19/14 260 lb (117.935 kg)  07/16/14 260 lb (117.935 kg)  11/19/12 248 lb 6.4 oz (112.674 kg)  07/28/11 235 lb (106.595 kg)    BMI:  Body mass index is 37.22 kg/(m^2).  Estimated Nutritional Needs:  Kcal:  1800-2000  Protein:  90-100 gm  Fluid:  1.8-2.0 L  Skin:  Reviewed, no issues  Diet Order:  Diet clear liquid Room service appropriate?: Yes; Fluid consistency:: Thin  EDUCATION NEEDS:  No education needs identified at this time   Intake/Output Summary (Last 24 hours) at 07/22/14 1407 Last data filed at 07/22/14 0907  Gross per 24 hour  Intake    320 ml  Output   2050 ml  Net  -1730 ml    Last BM:  6/20  Maureen Chatters, RD, LDN Pager #: [phone] After-Hours Pager #:  [phone]

## 2014-07-22 NOTE — Progress Notes (Signed)
Spoke with patient and mother at bedside. Patient states that she has Medicaid and follows up with [facility] practice. She lives at home with her parents, and can get to MD appointments, and has no difficulty obtaining or paying for medication. She has no needs for discharge at this time, will continue to follow.

## 2014-07-23 DIAGNOSIS — E669 Obesity, unspecified: Secondary | ICD-10-CM

## 2014-07-23 DIAGNOSIS — E876 Hypokalemia: Secondary | ICD-10-CM

## 2014-07-23 DIAGNOSIS — Z6837 Body mass index (BMI) 37.0-37.9, adult: Secondary | ICD-10-CM

## 2014-07-23 LAB — HIV ANTIBODY (ROUTINE TESTING W REFLEX): HIV SCREEN 4TH GENERATION: NONREACTIVE

## 2014-07-23 LAB — BASIC METABOLIC PANEL
Anion gap: 12 (ref 5–15)
BUN: 5 mg/dL — AB (ref 6–20)
CALCIUM: 9.2 mg/dL (ref 8.9–10.3)
CO2: 19 mmol/L — AB (ref 22–32)
Chloride: 104 mmol/L (ref 101–111)
Creatinine, Ser: 0.83 mg/dL (ref 0.44–1.00)
GFR calc non Af Amer: >60 mL/min (ref >60)
GLUCOSE: 120 mg/dL — AB (ref 65–99)
Potassium: 3.4 mmol/L — ABNORMAL LOW (ref 3.5–5.1)
SODIUM: 135 mmol/L (ref 135–145)

## 2014-07-23 MED ORDER — LORAZEPAM 1 MG PO TABS
1.0000 mg | ORAL_TABLET | Freq: Two times a day (BID) | ORAL | Status: DC
  Administered 2014-07-23 – 2014-07-24 (×3): 1 mg via ORAL
  Filled 2014-07-23 (×3): qty 1

## 2014-07-23 MED ORDER — MAGNESIUM SULFATE 2 GM/50ML IV SOLN
2.0000 g | Freq: Once | INTRAVENOUS | Status: AC
  Administered 2014-07-23: 2 g via INTRAVENOUS
  Filled 2014-07-23: qty 50

## 2014-07-23 MED ORDER — AMOXICILLIN-POT CLAVULANATE 875-125 MG PO TABS
1.0000 | ORAL_TABLET | Freq: Two times a day (BID) | ORAL | Status: DC
Start: 2014-07-23 — End: 2014-07-24
  Administered 2014-07-23 – 2014-07-24 (×2): 1 via ORAL
  Filled 2014-07-23 (×3): qty 1

## 2014-07-23 MED ORDER — POTASSIUM CHLORIDE CRYS ER 20 MEQ PO TBCR
40.0000 meq | EXTENDED_RELEASE_TABLET | Freq: Once | ORAL | Status: AC
  Administered 2014-07-23: 40 meq via ORAL
  Filled 2014-07-23: qty 2

## 2014-07-23 NOTE — Progress Notes (Signed)
Subjective:  Pt seen and examined in AM. She reports she was doing well yesterday until 8 PM where she felt anxious and nauseous again with dry heaving and some vomiting. She denies fever, chills, flank pain, suprapubic pain, or urinary symptoms.    Objective: Vital signs in last 24 hours: Filed Vitals:   07/23/14 0145 07/23/14 0154 07/23/14 0421 07/23/14 0500  BP: 156/117 158/112 115/77   Pulse:   104   Temp:   98.4 F (36.9 C)   TempSrc:   Oral   Resp:   20   Height:      Weight:    257 lb 8 oz (116.8 kg)  SpO2:   99%    Weight change: -9 lb 4.5 oz (-4.21 kg)  Intake/Output Summary (Last 24 hours) at 07/23/14 0918 Last data filed at 07/23/14 0842  Gross per 24 hour  Intake    240 ml  Output   1151 ml  Net   -911 ml   General Apperance: NAD Lungs: Clear to auscultation bilaterally. No wheezes, rhonchi or rales. Breathing comfortably Heart: Regular rate and rhythm, no murmur/rub/gallop Abdomen: Soft, nontender, nondistended, no rebound/guarding. No CVA tenderness.  Extremities: No edema Neurologic: Alert and oriented x 3. Psych: Anxious mood  Lab Results: Basic Metabolic Panel:  Recent Labs Lab 07/21/14 1931 07/22/14 0559 07/23/14 0650  NA  --  140 135  K  --  3.6 3.4*  CL  --  106 104  CO2  --  23 19*  GLUCOSE  --  107* 120*  BUN  --  6 5*  CREATININE  --  0.86 0.83  CALCIUM  --  8.7* 9.2  MG 1.7  --   --   PHOS 2.3*  --   --    Liver Function Tests:  Recent Labs Lab 07/19/14 1332 07/21/14 1025  AST 18 26  ALT 17 16  ALKPHOS 55 50  BILITOT 0.5 0.5  PROT 7.4 7.3  ALBUMIN 3.7 3.5    Recent Labs Lab 07/19/14 1332 07/21/14 1025  LIPASE 24 24   CBC:  Recent Labs Lab 07/19/14 1249 07/21/14 1025 07/22/14 0559  WBC 14.6* 6.6 6.3  NEUTROABS 12.6* 5.2  --   HGB 15.6* 14.0 13.6  HCT 44.7 40.4 39.7  MCV 88.7 87.6 87.8  PLT 251 307 275   Hemoglobin A1C:  Recent Labs Lab 07/21/14 1925  HGBA1C 5.8*   Fasting Lipid Panel:  Recent  Labs Lab 07/21/14 1931  CHOL 173  HDL 60  LDLCALC 97  TRIG 82  CHOLHDL 2.9   Urine Drug Screen: Drugs of Abuse     Component Value Date/Time   LABOPIA NONE DETECTED 07/19/2014 1219   COCAINSCRNUR NONE DETECTED 07/19/2014 1219   LABBENZ NONE DETECTED 07/19/2014 1219   AMPHETMU NONE DETECTED 07/19/2014 1219   THCU NONE DETECTED 07/19/2014 1219   LABBARB NONE DETECTED 07/19/2014 1219    Urinalysis:  Recent Labs Lab 07/19/14 1219 07/21/14 1230  COLORURINE RED* RED*  LABSPEC 1.025 1.014  PHURINE 5.0 6.0  GLUCOSEU 100* 100*  HGBUR LARGE* LARGE*  BILIRUBINUR SMALL* LARGE*  KETONESUR >80* 40*  PROTEINUR >300* 100*  UROBILINOGEN 2.0* 1.0  NITRITE POSITIVE* POSITIVE*  LEUKOCYTESUR TRACE* LARGE*   Micro Results: Recent Results (from the past 240 hour(s))  Urine culture     Status: None   Collection Time: 07/19/14 12:19 PM  Result Value Ref Range Status   Specimen Description URINE, RANDOM  Final   Special Requests  NONE  Final   Culture   Final    10,000 COLONIES/mL GROUP B STREP(S.AGALACTIAE)ISOLATED TESTING AGAINST S. AGALACTIAE NOT ROUTINELY PERFORMED DUE TO PREDICTABILITY OF AMP/PEN/VAN SUSCEPTIBILITY.    Report Status 07/20/2014 FINAL  Final  Wet prep, genital     Status: None   Collection Time: 07/21/14  1:21 PM  Result Value Ref Range Status   Yeast Wet Prep HPF POC NONE SEEN NONE SEEN Final   Trich, Wet Prep NONE SEEN NONE SEEN Final   Clue Cells Wet Prep HPF POC NONE SEEN NONE SEEN Final   WBC, Wet Prep HPF POC NONE SEEN NONE SEEN Final  Urine culture     Status: None (Preliminary result)   Collection Time: 07/21/14  2:31 PM  Result Value Ref Range Status   Specimen Description URINE, CLEAN CATCH  Final   Special Requests NONE  Final   Culture TOO YOUNG TO READ  Final   Report Status PENDING  Incomplete  MRSA PCR Screening     Status: None   Collection Time: 07/21/14  6:45 PM  Result Value Ref Range Status   MRSA by PCR NEGATIVE NEGATIVE Final     Comment:        The GeneXpert MRSA Assay (FDA approved for NASAL specimens only), is one component of a comprehensive MRSA colonization surveillance program. It is not intended to diagnose MRSA infection nor to guide or monitor treatment for MRSA infections.   Culture, blood (routine x 2)     Status: None (Preliminary result)   Collection Time: 07/21/14  7:21 PM  Result Value Ref Range Status   Specimen Description BLOOD LEFT ARM  Final   Special Requests BOTTLES DRAWN AEROBIC AND ANAEROBIC 5CC  Final   Culture NO GROWTH < 24 HOURS  Final   Report Status PENDING  Incomplete  Culture, blood (routine x 2)     Status: None (Preliminary result)   Collection Time: 07/21/14  7:24 PM  Result Value Ref Range Status   Specimen Description BLOOD LEFT HAND  Final   Special Requests BOTTLES DRAWN AEROBIC ONLY 1.5CC  Final   Culture NO GROWTH < 24 HOURS  Final   Report Status PENDING  Incomplete   Studies/Results: US Abdomen Complete  07/21/2014   CLINICAL DATA:  Vomiting.  EXAM: ULTRASOUND ABDOMEN COMPLETE  COMPARISON:  None.  FINDINGS: Gallbladder: No gallstones or wall thickening visualized. No sonographic Murphy sign noted.  Common bile duct: Diameter: Normal at 5 mm  Liver: Small anechoic lesion measuring 1.1 cm in the right hepatic lobe. No biliary duct dilatation. Normal echogenicity  IVC: No abnormality visualized.  Pancreas: Visualized portion unremarkable.  Spleen: Size and appearance within normal limits.  Right Kidney: Length: 11.5 cm. Echogenicity within normal limits. No mass or hydronephrosis visualized.  Left Kidney: Length: 11.1 cm. Echogenicity within normal limits. No mass or hydronephrosis visualized.  Abdominal aorta: No aneurysm visualized.  Other findings: None.  IMPRESSION:  WITH: IMPRESSION:  WITH 1. No acute findings by ultrasound. 2. Normal gallbladder and biliary tree. 3. Small hepatic cyst.   Electronically Signed   By: Genevive Bi M.D.   On: 07/21/2014 16:57    Medications: I have reviewed the patient's current medications. Scheduled Meds: . amLODipine  5 mg Oral Daily  . ampicillin-sulbactam (UNASYN) IV  1.5 g Intravenous Q6H  . enoxaparin (LOVENOX) injection  60 mg Subcutaneous Daily  . feeding supplement (RESOURCE BREEZE)  1 Container Oral TID BM  . LORazepam  1 mg  Oral BID  . magnesium citrate  0.5 Bottle Oral Once  . pneumococcal 23 valent vaccine  0.5 mL Intramuscular Tomorrow-1000  . senna-docusate  2 tablet Oral Once   Continuous Infusions:  PRN Meds:.acetaminophen, calcium carbonate, labetalol, metoCLOPramide (REGLAN) injection, ondansetron, promethazine  Assessment/Plan:  Uncomplicated Pyelonephritis - Pt with persistent nausea. Currently afebrile with no flank pain or urinary symptoms. Last urine culture on 6/21 with 10K Group B Step Agalactiae (reported b/c pt of child bearing age). US abdomen on 07/21/14 with no hydronephrosis.  -Advance diet to regular -Follow-up blood cultures x 2 -Follow-up repeat urine cultures from 6/23 (per micro lab, possibly enterococcus) -Transition later today Day 3  IV Unasyn 3g every 6 hours to Augmentin 500-125 mg BID for total 14 day course of antibiotics -Reglan  q6hr prn nausea, Zofran  q6hr prn nausea, phenergan 12.5mg  q6hr prn nausea  Anxiety - Pt with anxiety last night that improved with Ativan. Pt reports history of anxiety not on medical therapy. -Start Ativan 1 mg BID -Pt to follow-up with PCP to consider starting SSRI vs Wellbutrin (in setting of tobacco use)  Hypertension - BP 115/77 -Continue home amlodipine 5 mg daily -IV labetalol  Q2 hr prn hypertension  Hypokalemia  - K 3.4 this AM in setting of decreased PO intake and possible vomiting. Mg 1.7 yesterday. -Replete with kdur 40 mEQ x 1 dose  -Replete with 2 g of magnesium sulfate -Monitor  BMP and Mg   Pre-diabetes - A1c 5.8 on 07/21/14.  -Pt counseled on lifestyle changes  Obesity - BMI 37.22. -Appreciate RD  recommendations  -Pt counseled on lifestyle changes -Continue resource breeze 1 can TID between meals    HIV Screening - No prior screening.  -Follow-up HIV Ab  Diet: Regular DVT Ppx: Lovenox  Code: Full   Dispo: Disposition is deferred at this time, awaiting improvement of current medical problems.  Anticipated discharge in approximately 1 day(s).   The patient does have a current PCP [facility] Nuala Alpha, FNP) and does not need an [facility] hospital follow-up appointment after discharge.  The patient does not know have transportation limitations that hinder transportation to clinic appointments.  .Services Needed at time of discharge: Y = Yes, Blank = No PT:  No  OT:  No  RN:  No  Equipment:  No  Other:  None    LOS: 2 days   Otis Brace, MD 07/23/2014, 9:18 AM

## 2014-07-23 NOTE — Progress Notes (Signed)
Pt. B/p 142/101. Internal Medicine Teaching Service Md notified. Md stated he is ok with pt b/p. PRN labetalol is to be given if systolic is over 914 or diastolic over 110. Will cont. To monitor.

## 2014-07-24 LAB — BASIC METABOLIC PANEL
Anion gap: 8 (ref 5–15)
BUN: 11 mg/dL (ref 6–20)
CO2: 22 mmol/L (ref 22–32)
Calcium: 8.5 mg/dL — ABNORMAL LOW (ref 8.9–10.3)
Chloride: 107 mmol/L (ref 101–111)
Creatinine, Ser: 0.96 mg/dL (ref 0.44–1.00)
GFR calc Af Amer: >60 mL/min (ref >60)
Glucose, Bld: 111 mg/dL — ABNORMAL HIGH (ref 65–99)
Potassium: 4.6 mmol/L (ref 3.5–5.1)
Sodium: 137 mmol/L (ref 135–145)

## 2014-07-24 LAB — URINE CULTURE: Culture: 20000

## 2014-07-24 LAB — MAGNESIUM: Magnesium: 2.3 mg/dL (ref 1.7–2.4)

## 2014-07-24 LAB — PHOSPHORUS: Phosphorus: 3.7 mg/dL (ref 2.5–4.6)

## 2014-07-24 MED ORDER — AMOXICILLIN-POT CLAVULANATE 875-125 MG PO TABS: 1.0000 | ORAL_TABLET | Freq: Two times a day (BID) | ORAL | Status: DC

## 2014-07-24 MED ORDER — LORAZEPAM 1 MG PO TABS: 1.0000 mg | ORAL_TABLET | Freq: Two times a day (BID) | ORAL | Status: DC

## 2014-07-24 MED ORDER — LORAZEPAM 1 MG PO TABS: 1.0000 mg | ORAL_TABLET | Freq: Two times a day (BID) | ORAL | Status: DC | PRN

## 2014-07-24 NOTE — Progress Notes (Signed)
Noted order to DC patient home.DC education provided and patient verbalized understanding

## 2014-07-24 NOTE — Progress Notes (Signed)
Subjective:  Pt seen and examined in AM. No acute events overnight. She is feeling well and tolerated regular diet yesterday. She had one episode of vomiting this morning due to being hungry but currently denies nausea or vomiting. She denies fever, chills, flank pain, suprapubic pain, or urinary symptoms. She had BM this morning. She feels less anxious and is ready to go home.   Objective: Vital signs in last 24 hours: Filed Vitals:   07/23/14 1005 07/23/14 1427 07/23/14 2129 07/24/14 0554  BP: 120/78 142/101 128/72 100/69  Pulse:  109  95  Temp:  98.3 F (36.8 C) 98.3 F (36.8 C) 98.6 F (37 C)  TempSrc:  Oral Oral Oral  Resp:  Height:      Weight:    257 lb 15 oz (117 kg)  SpO2:  100%  94%   Weight change: 7.1 oz (0.2 kg)  Intake/Output Summary (Last 24 hours) at 07/24/14 0955 Last data filed at 07/24/14 0910  Gross per 24 hour  Intake   1062 ml  Output    850 ml  Net    212 ml   General Apperance: NAD Lungs: Clear to auscultation bilaterally. No wheezes, rhonchi or rales. Breathing comfortably Heart: Regular rate and rhythm, no murmur/rub/gallop Abdomen: Soft, nontender, nondistended, no rebound/guarding. No CVA tenderness.  Extremities: No edema Neurologic: Alert and oriented x 3. Psych: Normal mood  Lab Results: Basic Metabolic Panel:  Recent Labs Lab 07/21/14 1931  07/23/14 0650 07/24/14 0345  NA  --   < > 135 137  K  --   < > 3.4* 4.6  CL  --   < > 104 107  CO2  --   < > 19* 22  GLUCOSE  --   < > 120* 111*  BUN  --   < > 5* 11  CREATININE  --   < > 0.83 0.96  CALCIUM  --   < > 9.2 8.5*  MG 1.7  --   --  2.3  PHOS 2.3*  --   --  3.7  < > = values in this interval not displayed. Liver Function Tests:  Recent Labs Lab 07/19/14 1332 07/21/14 1025  AST 18 26  ALT 17 16  ALKPHOS 55 50  BILITOT 0.5 0.5  PROT 7.4 7.3  ALBUMIN 3.7 3.5    Recent Labs Lab 07/19/14 1332 07/21/14 1025  LIPASE 24 24   CBC:  Recent Labs Lab  07/19/14 1249 07/21/14 1025 07/22/14 0559  WBC 14.6* 6.6 6.3  NEUTROABS 12.6* 5.2  --   HGB 15.6* 14.0 13.6  HCT 44.7 40.4 39.7  MCV 88.7 87.6 87.8  PLT 251 307 275   Hemoglobin A1C:  Recent Labs Lab 07/21/14 1925  HGBA1C 5.8*   Fasting Lipid Panel:  Recent Labs Lab 07/21/14 1931  CHOL 173  HDL 60  LDLCALC 97  TRIG 82  CHOLHDL 2.9   Urine Drug Screen: Drugs of Abuse     Component Value Date/Time   LABOPIA NONE DETECTED 07/19/2014 1219   COCAINSCRNUR NONE DETECTED 07/19/2014 1219   LABBENZ NONE DETECTED 07/19/2014 1219   AMPHETMU NONE DETECTED 07/19/2014 1219   THCU NONE DETECTED 07/19/2014 1219   LABBARB NONE DETECTED 07/19/2014 1219    Urinalysis:  Recent Labs Lab 07/19/14 1219 07/21/14 1230  COLORURINE RED* RED*  LABSPEC 1.025 1.014  PHURINE 5.0 6.0  GLUCOSEU 100* 100*  HGBUR LARGE* LARGE*  BILIRUBINUR SMALL* LARGE*  KETONESUR >80*  40*  PROTEINUR >300* 100*  UROBILINOGEN 2.0* 1.0  NITRITE POSITIVE* POSITIVE*  LEUKOCYTESUR TRACE* LARGE*   Micro Results: Recent Results (from the past 240 hour(s))  Urine culture     Status: None   Collection Time: 07/19/14 12:19 PM  Result Value Ref Range Status   Specimen Description URINE, RANDOM  Final   Special Requests NONE  Final   Culture   Final    10,000 COLONIES/mL GROUP B STREP(S.AGALACTIAE)ISOLATED TESTING AGAINST S. AGALACTIAE NOT ROUTINELY PERFORMED DUE TO PREDICTABILITY OF AMP/PEN/VAN SUSCEPTIBILITY.    Report Status 07/20/2014 FINAL  Final  Wet prep, genital     Status: None   Collection Time: 07/21/14  1:21 PM  Result Value Ref Range Status   Yeast Wet Prep HPF POC NONE SEEN NONE SEEN Final   Trich, Wet Prep NONE SEEN NONE SEEN Final   Clue Cells Wet Prep HPF POC NONE SEEN NONE SEEN Final   WBC, Wet Prep HPF POC NONE SEEN NONE SEEN Final  Urine culture     Status: None   Collection Time: 07/21/14  2:31 PM  Result Value Ref Range Status   Specimen Description URINE, CLEAN CATCH  Final    Special Requests NONE  Final   Culture 20,000 COLONIES/mL ENTEROCOCCUS SPECIES  Final   Report Status 07/24/2014 FINAL  Final   Organism ID, Bacteria ENTEROCOCCUS SPECIES  Final      Susceptibility   Enterococcus species - MIC*    AMPICILLIN <=2 SENSITIVE Sensitive     LEVOFLOXACIN >=8 RESISTANT Resistant     NITROFURANTOIN <=16 SENSITIVE Sensitive     VANCOMYCIN 1 SENSITIVE Sensitive     LINEZOLID 1 SENSITIVE Sensitive     * 20,000 COLONIES/mL ENTEROCOCCUS SPECIES  MRSA PCR Screening     Status: None   Collection Time: 07/21/14  6:45 PM  Result Value Ref Range Status   MRSA by PCR NEGATIVE NEGATIVE Final    Comment:        The GeneXpert MRSA Assay (FDA approved for NASAL specimens only), is one component of a comprehensive MRSA colonization surveillance program. It is not intended to diagnose MRSA infection nor to guide or monitor treatment for MRSA infections.   Culture, blood (routine x 2)     Status: None (Preliminary result)   Collection Time: 07/21/14  7:21 PM  Result Value Ref Range Status   Specimen Description BLOOD LEFT ARM  Final   Special Requests BOTTLES DRAWN AEROBIC AND ANAEROBIC 5CC  Final   Culture NO GROWTH 2 DAYS  Final   Report Status PENDING  Incomplete  Culture, blood (routine x 2)     Status: None (Preliminary result)   Collection Time: 07/21/14  7:24 PM  Result Value Ref Range Status   Specimen Description BLOOD LEFT HAND  Final   Special Requests BOTTLES DRAWN AEROBIC ONLY 1.5CC  Final   Culture NO GROWTH 2 DAYS  Final   Report Status PENDING  Incomplete   Studies/Results: No results found. Medications: I have reviewed the patient's current medications. Scheduled Meds: . amLODipine  5 mg Oral Daily  . amoxicillin-clavulanate  1 tablet Oral Q12H  . enoxaparin (LOVENOX) injection  60 mg Subcutaneous Daily  . feeding supplement (RESOURCE BREEZE)  1 Container Oral TID BM  . LORazepam  1 mg Oral BID  . magnesium citrate  0.5 Bottle Oral Once  .  pneumococcal 23 valent vaccine  0.5 mL Intramuscular Tomorrow-1000  . senna-docusate  2 tablet Oral Once  Continuous Infusions:  PRN Meds:.acetaminophen, calcium carbonate, labetalol, metoCLOPramide (REGLAN) injection, ondansetron, promethazine  Assessment/Plan:  Uncomplicated Pyelonephritis - Pt with improved nausea/vomiting and able to tolerate PO intake. Currently afebrile with no flank pain or urinary symptoms. Urine cultures from 07/24/14 with PCN sensitive 20K Enterococcus, resistant to levo which she failed as oupatient. Last urine culture on 6/21 with 10K Group B Step Agalactiae (reported b/c pt of child bearing age). US abdomen on 07/21/14 with no hydronephrosis.  -Follow-up blood cultures x 2 (NGTD) -Continue Augmentin 875-125 mg BID for total 14 day course of antibiotics (currently on day 4) -Continue home  phenergan 25 mg Q 6 hr PRN nausea/vomiting   Anxiety - Pt with improved anxiety with Ativan. Pt reports history of anxiety not on medical therapy. -Continue Ativan 1 mg BID PRN (to be given short supply on discharge until PCP follow-up) -Pt to follow-up with PCP to consider starting SSRI vs Wellbutrin (in setting of tobacco use)  Hypertension - BP 100/69 at goal <140/90 -Continue home amlodipine 5 mg daily on discharge   Pre-diabetes - A1c 5.8 on 07/21/14.  -Pt counseled on lifestyle changes  Obesity - BMI 37.22. -Appreciate RD recommendations  -Pt counseled on lifestyle changes  HIV Screening - Negative for HIV infection.   Diet: Regular DVT Ppx: Lovenox  Code: Full   Dispo: Today  The patient does have a current PCP [facility] N[facility]a Alpha, FNP) and does not need an [facility] hospital follow-up appointment after discharge.  The patient does not know have transportation limitations that hinder transportation to clinic appointments.  .Services Needed at time of discharge: Y = Yes, Blank = No PT:  No  OT:  No  RN:  No  Equipment:  No  Other:  None    LOS: 3 days    Otis Brace, MD 07/24/2014, 9:55 AM

## 2014-07-24 NOTE — Discharge Instructions (Signed)
-  Start taking the antibiotic augmentin twice a day starting tonight at 10 PM for your kidney infection, take it for the next 10 days until all the pills are gone. -Take phenergan as needed for nausea -Take ativan 1 mg twice a day as needed for anxiety -Please see your primary doctor in 1 week -Pleasure taking care of you, hope you were satisfied with our care

## 2014-07-25 NOTE — Discharge Summary (Signed)
Name: Bonnie Kelly MRN: [id number] DOB: April 06, 1975 39 y.o. PCP: Vista Mink, FNP  Date of Admission: 07/21/2014 10:03 AM Date of Discharge: 07/24/2014 Attending Physician: Gilles Chiquito, M.D.  Discharge Diagnosis: Principal Problem:   Pyelonephritis Active Problems:   Prediabetes   Tobacco use   Obesity  Discharge Medications:   Medication List    TAKE these medications        amLODipine 5 MG tablet  Commonly known as:  NORVASC  Take 5 mg by mouth daily.     amoxicillin-clavulanate 875-125 MG per tablet  Commonly known as:  AUGMENTIN  Take 1 tablet by mouth every 12 (twelve) hours.     etonogestrel 68 MG Impl implant  Commonly known as:  NEXPLANON  Inject 1 each into the skin once.     LORazepam 1 MG tablet  Commonly known as:  ATIVAN  Take 1 tablet (1 mg total) by mouth 2 (two) times daily as needed for anxiety.     promethazine 25 MG tablet  Commonly known as:  PHENERGAN  Take 25 mg by mouth every 6 (six) hours as needed for nausea or vomiting.        Disposition and follow-up:   Ms.Bonnie Kelly was discharged from [facility] in Stable condition.  At the hospital follow up visit please address:  1.  Uncomplicated Pyelonephritis: Her antibiotics were transitioned to Augmentin 875-125 mg twice a day for total 14 day course of antibiotics (end date: 07/04/2014).  Anxiety: Continued on Ativan 1 mg twice a day when necessary. Patient to follow-up with PCP to consider starting SSRI versus Wellbutrin (in setting of tobacco use)  2.  Labs / imaging needed at time of follow-up: None  3.  Pending labs/ test needing follow-up: None  Follow-up Appointments:     Follow-up Information    Follow up with Lindley,Cheryl Paulette, FNP In 1 week.   Specialty:  Family Medicine   Why:  Please call to schedule an appointment    Contact information:   [place] [address] [phone]       Consultations:  None  Procedures Performed:  US Abdomen Complete  07/21/2014   CLINICAL DATA:  Vomiting.  EXAM: ULTRASOUND ABDOMEN COMPLETE  COMPARISON:  None.  FINDINGS: Gallbladder: No gallstones or wall thickening visualized. No sonographic Murphy sign noted.  Common bile duct: Diameter: Normal at 5 mm  Liver: Small anechoic lesion measuring 1.1 cm in the right hepatic lobe. No biliary duct dilatation. Normal echogenicity  IVC: No abnormality visualized.  Pancreas: Visualized portion unremarkable.  Spleen: Size and appearance within normal limits.  Right Kidney: Length: 11.5 cm. Echogenicity within normal limits. No mass or hydronephrosis visualized.  Left Kidney: Length: 11.1 cm. Echogenicity within normal limits. No mass or hydronephrosis visualized.  Abdominal aorta: No aneurysm visualized.  Other findings: None.  IMPRESSION:  WITH: IMPRESSION:  WITH 1. No acute findings by ultrasound. 2. Normal gallbladder and biliary tree. 3. Small hepatic cyst.   Electronically Signed   By: Suzy Bouchard M.D.   On: 07/21/2014 16:57    Admission HPI: Ms. Bonnie Kelly is a 39 year old woman with history of hypertension, chronic anemia presenting with nausea and vomiting. She reports that started on Saturday morning. She went to [facility] ED. She was thought to have viral gastroenteritis. She discharged with Phenergan. She felt better on Sunday. Tuesday, her nausea and vomiting restarted. She was found to have a leukocytosis of 14.6. She was  thought to have pyelonephritis. She is given IV Rocephin. She was given IV Reglan with improvement of her nausea. She was discharged with Cipro. She felt better yesterday. Today she began to have nausea and vomiting again. She denies sick contacts or abdominal surgeries. She reports she is currently on her menstrual period. She reports abdominal pain in her right upper quadrant radiating to back that started 2 weeks ago. It has been getting better. She's never had pain like this before. It is  constant. No exacerbating or relieving factors. Denies fevers or chills, cough, shortness of breath, chest pain, diarrhea, dysuria, hematuria, edema, rash. Last bowel movement Monday.  Hospital Course by problem list: Principal Problem:   Pyelonephritis Active Problems:   Prediabetes   Tobacco use   Obesity   1. Uncomplicated Pyelonephritis: Left CVA tenderness. Nausea and vomiting likely secondary to pyelonephritis. Afebrile. Tachycardic with heart rate between 100-121. No leukocytosis. Urinalysis with large hemoglobin, positive nitrite, large leukocytes, few bacteria, 21-50 WBC. Urine culture from 07/19/2014 with 10,000 colonies/mL group B strep. Ultrasound of abdomen on 07/21/2014 with no hydronephrosis. Wet prep negative. GC/Chlamydia negative. She was started on Unasyn 3 g every 6 hours. She was continued on Reglan 10 mg every 6 hours as needed for nausea. Her urine culture grew 20,000 colonies per milliliter enterococcus resistant to levofloxacin. Blood cultures were negative. Her antibiotics were transitioned to Augmentin 875-125 mg twice a day for total 14 day course of antibiotics (end date: 07/04/2014). At discharge her symptoms had improved.  2. Anxiety: Patient reports history of anxiety not on medical therapy. Her anxiety improved with Ativan. Continue Ativan 1 mg twice a day when necessary. Patient to follow-up with PCP to consider starting SSRI versus Wellbutrin (in setting of tobacco use)  3. Hypertension: On amlodipine 5 mg daily at home. BP in ED 118/99-151/108. She was continued on her home amlodipine 5 mg daily. Her blood pressure was controlled on this regimen.  Discharge Vitals:   BP 100/69 mmHg  Pulse 95  Temp(Src) 98.6 F (37 C) (Oral)  Resp 18  Ht 5' 11"  (1.803 m)  Wt 257 lb 15 oz (117 kg)  BMI 35.99 kg/m2  SpO2 94%  LMP 07/13/2014  Discharge Labs:  Results for orders placed or performed during the hospital encounter of 07/21/14 (from the past 72 hour(s))  HIV  antibody     Status: None   Collection Time: 07/23/14  6:50 AM  Result Value Ref Range   HIV Screen 4th Generation wRfx Non Reactive Non Reactive    Comment: (NOTE) Performed At: [facility] [place], [place] [id number] Lindon Romp MD [id number]   Basic metabolic panel     Status: Abnormal   Collection Time: 07/23/14  6:50 AM  Result Value Ref Range   Sodium 135 135 - 145 mmol/L   Potassium 3.4 (L) 3.5 - 5.1 mmol/L   Chloride 104 101 - 111 mmol/L   CO2 19 (L) 22 - 32 mmol/L   Glucose, Bld 120 (H) 65 - 99 mg/dL   BUN 5 (L) 6 - 20 mg/dL   Creatinine, Ser 0.83 0.44 - 1.00 mg/dL   Calcium 9.2 8.9 - 10.3 mg/dL   GFR calc non Af Amer >60 >60 mL/min   GFR calc Af Amer >60 >60 mL/min    Comment: (NOTE) The eGFR has been calculated using the CKD EPI equation. This calculation has not been validated in all clinical situations. eGFR's persistently <60 mL/min signify possible Chronic Kidney  Disease.    Anion gap 12 5 - 15  Phosphorus     Status: None   Collection Time: 07/24/14  3:45 AM  Result Value Ref Range   Phosphorus 3.7 2.5 - 4.6 mg/dL  Basic metabolic panel     Status: Abnormal   Collection Time: 07/24/14  3:45 AM  Result Value Ref Range   Sodium 137 135 - 145 mmol/L   Potassium 4.6 3.5 - 5.1 mmol/L    Comment: DELTA CHECK NOTED SPECIMEN HEMOLYZED. HEMOLYSIS MAY AFFECT INTEGRITY OF RESULTS.    Chloride 107 101 - 111 mmol/L   CO2 22 22 - 32 mmol/L   Glucose, Bld 111 (H) 65 - 99 mg/dL   BUN 11 6 - 20 mg/dL   Creatinine, Ser 0.96 0.44 - 1.00 mg/dL   Calcium 8.5 (L) 8.9 - 10.3 mg/dL   GFR calc non Af Amer >60 >60 mL/min   GFR calc Af Amer >60 >60 mL/min    Comment: (NOTE) The eGFR has been calculated using the CKD EPI equation. This calculation has not been validated in all clinical situations. eGFR's persistently <60 mL/min signify possible Chronic Kidney Disease.    Anion gap 8 5 - 15  Magnesium     Status: None   Collection Time:  07/24/14  3:45 AM  Result Value Ref Range   Magnesium 2.3 1.7 - 2.4 mg/dL    Signed: Milagros Loll, MD 07/25/2014, 11:39 AM    Services Ordered on Discharge: None Equipment Ordered on Discharge: None

## 2014-07-26 LAB — CULTURE, BLOOD (ROUTINE X 2)
CULTURE: NO GROWTH
Culture: NO GROWTH

## 2015-02-08 ENCOUNTER — Emergency Department (HOSPITAL_COMMUNITY)
Admission: EM | Admit: 2015-02-08 | Discharge: 2015-02-08 | Disposition: A | Discharge status: Home / Self Care | Payer: Medicaid Other | Attending: Emergency Medicine | Admitting: Emergency Medicine

## 2015-02-08 ENCOUNTER — Encounter (HOSPITAL_COMMUNITY): Payer: Self-pay | Admitting: *Deleted

## 2015-02-08 ENCOUNTER — Emergency Department (HOSPITAL_COMMUNITY): Payer: Medicaid Other

## 2015-02-08 DIAGNOSIS — Z862 Personal history of diseases of the blood and blood-forming organs and certain disorders involving the immune mechanism: Secondary | ICD-10-CM | POA: Insufficient documentation

## 2015-02-08 DIAGNOSIS — Y92000 Kitchen of unspecified non-institutional (private) residence as  the place of occurrence of the external cause: Secondary | ICD-10-CM | POA: Insufficient documentation

## 2015-02-08 DIAGNOSIS — F17210 Nicotine dependence, cigarettes, uncomplicated: Secondary | ICD-10-CM | POA: Insufficient documentation

## 2015-02-08 DIAGNOSIS — M79671 Pain in right foot: Secondary | ICD-10-CM

## 2015-02-08 DIAGNOSIS — I10 Essential (primary) hypertension: Secondary | ICD-10-CM | POA: Insufficient documentation

## 2015-02-08 DIAGNOSIS — Y9389 Activity, other specified: Secondary | ICD-10-CM | POA: Insufficient documentation

## 2015-02-08 DIAGNOSIS — Z79899 Other long term (current) drug therapy: Secondary | ICD-10-CM | POA: Insufficient documentation

## 2015-02-08 DIAGNOSIS — S99921A Unspecified injury of right foot, initial encounter: Secondary | ICD-10-CM | POA: Insufficient documentation

## 2015-02-08 DIAGNOSIS — F419 Anxiety disorder, unspecified: Secondary | ICD-10-CM | POA: Insufficient documentation

## 2015-02-08 DIAGNOSIS — Y998 Other external cause status: Secondary | ICD-10-CM | POA: Insufficient documentation

## 2015-02-08 DIAGNOSIS — W1839XA Other fall on same level, initial encounter: Secondary | ICD-10-CM | POA: Insufficient documentation

## 2015-02-08 DIAGNOSIS — Z973 Presence of spectacles and contact lenses: Secondary | ICD-10-CM | POA: Insufficient documentation

## 2015-02-08 MED ORDER — HYDROCODONE-ACETAMINOPHEN 5-325 MG PO TABS
1.0000 | ORAL_TABLET | ORAL | Status: AC
  Administered 2015-02-08: 1 via ORAL
  Filled 2015-02-08: qty 1

## 2015-02-08 MED ORDER — TRAMADOL HCL 50 MG PO TABS: 50.0000 mg | ORAL_TABLET | Freq: Four times a day (QID) | ORAL | Status: DC | PRN

## 2015-02-08 NOTE — Discharge Instructions (Signed)
Metatarsal Fracture A metatarsal fracture is a break in a metatarsal bone. Metatarsal bones connect your toe bones to your ankle bones. CAUSES This type of fracture may be caused by:  A sudden twisting of your foot.  A fall onto your foot.  Overuse or repetitive exercise. RISK FACTORS This condition is more likely to develop in people who:  Play contact sports.  Have a bone disease.  Have a low calcium level. SYMPTOMS Symptoms of this condition include:  Pain that is worse when walking or standing.  Pain when pressing on the foot or moving the toes.  Swelling.  Bruising on the top or bottom of the foot.  A foot that appears shorter than the other one. DIAGNOSIS This condition is diagnosed with a physical exam. You may also have imaging tests, such as:  X-rays.  A CT scan.  MRI. TREATMENT Treatment for this condition depends on its severity and whether a bone has moved out of place. Treatment may involve:  Rest.  Wearing foot support such as a cast, splint, or boot for several weeks.  Using crutches.  Surgery to move bones back into the right position. Surgery is usually needed if there are many pieces of broken bone or bones that are very out of place (displaced fracture).  Physical therapy. This may be needed to help you regain full movement and strength in your foot. You will need to return to your health care provider to have X-rays taken until your bones heal. Your health care provider will look at the X-rays to make sure that your foot is healing well. HOME CARE INSTRUCTIONS  If You Have a Cast:  Do not stick anything inside the cast to scratch your skin. Doing that increases your risk of infection.  Check the skin around the cast every day. Report any concerns to your health care provider. You may put lotion on dry skin around the edges of the cast. Do not apply lotion to the skin underneath the cast.  Keep the cast clean and dry. If You Have a Splint  or a Supportive Boot:  Wear it as directed by your health care provider. Remove it only as directed by your health care provider.  Loosen it if your toes become numb and tingle, or if they turn cold and blue.  Keep it clean and dry. Bathing  Do not take baths, swim, or use a hot tub until your health care provider approves. Ask your health care provider if you can take showers. You may only be allowed to take sponge baths for bathing.  If your health care provider approves bathing and showering, cover the cast or splint with a watertight plastic bag to protect it from water. Do not let the cast or splint get wet. Managing Pain, Stiffness, and Swelling  If directed, apply ice to the injured area (if you have a splint, not a cast).  Put ice in a plastic bag.  Place a towel between your skin and the bag.  Leave the ice on for 20 minutes, 2-3 times per day.  Move your toes often to avoid stiffness and to lessen swelling.  Raise (elevate) the injured area above the level of your heart while you are sitting or lying down. Driving  Do not drive or operate heavy machinery while taking pain medicine.  Do not drive while wearing foot support on a foot that you use for driving. Activity  Return to your normal activities as directed by your health care   provider. Ask your health care provider what activities are safe for you.  Perform exercises as directed by your health care provider or physical therapist. Safety  Do not use the injured foot to support your body weight until your health care provider says that you can. Use crutches as directed by your health care provider. General Instructions  Do not put pressure on any part of the cast or splint until it is fully hardened. This may take several hours.  Do not use any tobacco products, including cigarettes, chewing tobacco, or e-cigarettes. Tobacco can delay bone healing. If you need help quitting, ask your health care  provider.  Take medicines only as directed by your health care provider.  Keep all follow-up visits as directed by your health care provider. This is important. SEEK MEDICAL CARE IF:  You have a fever.  Your cast, splint, or boot is too loose or too tight.  Your cast, splint, or boot is damaged.  Your pain medicine is not helping.  You have pain, tingling, or numbness in your foot that is not going away. SEEK IMMEDIATE MEDICAL CARE IF:  You have severe pain.  You have tingling or numbness in your foot that is getting worse.  Your foot feels cold or becomes numb.  Your foot changes color.   This information is not intended to replace advice given to you by your health care provider. Make sure you discuss any questions you have with your health care provider.   Document Released: 10/06/2001 Document Revised: 05/31/2014 Document Reviewed: 11/10/2013 Elsevier Interactive Patient Education 2016 [employer]  

## 2015-02-08 NOTE — Progress Notes (Signed)
Orthopedic Tech Progress Note Patient Details:  Ferol LuzChylene R Cotham Jul 06, 1975 [id number]  Ortho Devices Type of Ortho Device: Ace wrap, Post (short leg) splint Ortho Device/Splint Location: RLE Ortho Device/Splint Interventions: Application, Ordered   Jennye MoccasinHughes, Antoniette Peake Craig 02/08/2015, 9:59 PM

## 2015-02-08 NOTE — ED Notes (Signed)
The patient was able to stand up and move from the wheelchair to the bed without any assistance.

## 2015-02-08 NOTE — ED Provider Notes (Signed)
CSN: [id number]     Arrival date & time 02/08/15  1903 History   First MD Initiated Contact with Patient 02/08/15 2116     Chief Complaint  Patient presents with  . Dizziness  . Fall  . Foot Pain   HPI Pt had been feeling nauseated yesterday.  She did not eat much during the day.  She took her night time meds which include anxiety meds.  She went to walk into the kitchen and she felt dizzy and sat funny on her foot.  SHe started having pain. SHe did not lose consciousness.  SHe does not have headache or vomiting.  NO chest pain.  TOday she has had pain and swelling in her right foot.  No other injuries. Past Medical History  Diagnosis Date  . Closed fracture of right tibia and fibula 07/28/2011  . Wears glasses   . Painful orthopaedic hardware 11/19/2012  . Hypertension   . Anemia   . Anxiety    Past Surgical History  Procedure Laterality Date  . Birth control implant in l arm Left 11/2011    implanon  . Tibia im nail insertion  07/28/2011    Procedure: INTRAMEDULLARY (IM) NAIL TIBIAL;  Surgeon: Eulas Post, MD;  Location: [facility] OR;  Service: Orthopedics;  Laterality: Right;  . Hardware removal Right 11/19/2012    Procedure: HARDWARE REMOVAL RIGHT TIBIAL NAIL AND SCREWS;  Surgeon: Eulas Post, MD;  Location: [facility];  Service: Orthopedics;  Laterality: Right;  . Fracture surgery    . Dilation and curettage of uterus  1997   Family History  Problem Relation Age of Onset  . Diabetes Mother   . Hypertension Mother   . Heart failure Mother    Social History  Substance Use Topics  . Smoking status: Current Every Day Smoker -- 1.00 packs/day for 27 years    Types: Cigarettes  . Smokeless tobacco: Never Used  . Alcohol Use: Yes     Comment: 07/21/2014 "I'll have a couple drinks q 6 months"   OB History    No data available     Review of Systems    Allergies  Review of patient's allergies indicates no known allergies.  Home Medications   Prior to  Admission medications   Medication Sig Start Date End Date Taking? Authorizing Provider  amitriptyline (ELAVIL) 100 MG tablet Take 100 mg by mouth at bedtime.   Yes Historical Provider, MD  amLODipine (NORVASC) 10 MG tablet Take 10 mg by mouth daily.   Yes Historical Provider, MD  butalbital-acetaminophen-caffeine (FIORICET, ESGIC) 50-325-40 MG tablet Take 1 tablet by mouth daily as needed for headache or migraine.   Yes Historical Provider, MD  etonogestrel (IMPLANON) 68 MG IMPL implant Inject 1 each into the skin once.   Yes Historical Provider, MD  LORazepam (ATIVAN) 1 MG tablet Take 1 tablet (1 mg total) by mouth 2 (two) times daily as needed for anxiety. Patient taking differently: Take 1 mg by mouth 2 (two) times daily.  07/24/14  Yes Otis Brace, MD  pravastatin (PRAVACHOL) 20 MG tablet Take 20 mg by mouth at bedtime.   Yes Historical Provider, MD  topiramate (TOPAMAX) 25 MG capsule Take 25 mg by mouth at bedtime.   Yes Historical Provider, MD  amoxicillin-clavulanate (AUGMENTIN) 875-125 MG per tablet Take 1 tablet by mouth every 12 (twelve) hours. Patient not taking: Reported on 02/08/2015 07/24/14   Otis Brace, MD  traMADol (ULTRAM) 50 MG tablet Take 1 tablet (  50 mg total) by mouth every 6 (six) hours as needed. 02/08/15   Linwood DibblesJon Ethyn Schetter, MD   BP 118/84 mmHg  Pulse 89  Temp(Src) 98.4 F (36.9 C)  Resp 18  Ht 5\' 11"  (1.803 m)  Wt 120.203 kg  BMI 36.98 kg/m2  SpO2 100% Physical Exam  Constitutional: She appears well-developed and well-nourished. No distress.  HENT:  Head: Normocephalic and atraumatic.  Right Ear: External ear normal.  Left Ear: External ear normal.  Eyes: Conjunctivae are normal. Right eye exhibits no discharge. Left eye exhibits no discharge. No scleral icterus.  Neck: Neck supple. No tracheal deviation present.  Cardiovascular: Normal rate.   Pulmonary/Chest: Effort normal. No stridor. No respiratory distress.  Musculoskeletal: She exhibits tenderness. She  exhibits no edema.       Left foot: There is tenderness, bony tenderness and swelling. There is normal range of motion and no deformity.  Neurological: She is alert. Cranial nerve deficit: no gross deficits.  Skin: Skin is warm and dry. No rash noted.  Psychiatric: She has a normal mood and affect.  Nursing note and vitals reviewed.   ED Course  Procedures (including critical care time) Labs Review Labs Reviewed - No data to display  Imaging Review Dg Foot Complete Right  02/08/2015  CLINICAL DATA:  Right foot pain and swelling after fall. EXAM: RIGHT FOOT COMPLETE - 3+ VIEW COMPARISON:  11/18/2008 FINDINGS: Cortical irregularity at the base of the second metatarsal appreciated only on the AP view. This is in the region of the Lisfranc ligament insertion. There is otherwise no evidence of fracture. The alignment is maintained. There is an os peroneal. Prominent plantar calcaneal spur has increased from prior exam. Dorsal soft tissue edema. IMPRESSION: Cortical irregularity at the base of the second metatarsal, appreciated only on the AP view. This may represent a fracture of indeterminate age. CT may be helpful for further evaluation. Dorsal soft tissue edema. Electronically Signed   By: Rubye OaksMelanie  Ehinger M.D.   On: 02/08/2015 20:15   I have personally reviewed and evaluated these images and lab results as part of my medical decision-making.    MDM   Final diagnoses:  Foot pain, right    Possible fx on xray.  Will splint. Crutches.  Follow up with orthopedist 1 week    Linwood DibblesJon Stana Bayon, MD 02/08/15 2310

## 2015-02-08 NOTE — ED Notes (Signed)
Pt reports she did not eat much on Tuesday and was nauseated . Pt reports when she went to kitchen she was dizzy and passed out. Pt denies hitting her head but has pain in LT foot.

## 2015-05-20 ENCOUNTER — Emergency Department (HOSPITAL_COMMUNITY)
Admission: EM | Admit: 2015-05-20 | Discharge: 2015-05-20 | Disposition: A | Discharge status: Home / Self Care | Payer: Self-pay | Attending: Emergency Medicine | Admitting: Emergency Medicine

## 2015-05-20 ENCOUNTER — Emergency Department (HOSPITAL_COMMUNITY): Payer: Self-pay

## 2015-05-20 ENCOUNTER — Encounter (HOSPITAL_COMMUNITY): Payer: Self-pay

## 2015-05-20 DIAGNOSIS — R600 Localized edema: Secondary | ICD-10-CM | POA: Insufficient documentation

## 2015-05-20 DIAGNOSIS — F17210 Nicotine dependence, cigarettes, uncomplicated: Secondary | ICD-10-CM | POA: Insufficient documentation

## 2015-05-20 DIAGNOSIS — R609 Edema, unspecified: Secondary | ICD-10-CM

## 2015-05-20 DIAGNOSIS — Z8781 Personal history of (healed) traumatic fracture: Secondary | ICD-10-CM | POA: Insufficient documentation

## 2015-05-20 DIAGNOSIS — F419 Anxiety disorder, unspecified: Secondary | ICD-10-CM | POA: Insufficient documentation

## 2015-05-20 DIAGNOSIS — Z973 Presence of spectacles and contact lenses: Secondary | ICD-10-CM | POA: Insufficient documentation

## 2015-05-20 DIAGNOSIS — Z79899 Other long term (current) drug therapy: Secondary | ICD-10-CM | POA: Insufficient documentation

## 2015-05-20 DIAGNOSIS — Z862 Personal history of diseases of the blood and blood-forming organs and certain disorders involving the immune mechanism: Secondary | ICD-10-CM | POA: Insufficient documentation

## 2015-05-20 DIAGNOSIS — I10 Essential (primary) hypertension: Secondary | ICD-10-CM | POA: Insufficient documentation

## 2015-05-20 LAB — BASIC METABOLIC PANEL
Anion gap: 10 (ref 5–15)
BUN: 9 mg/dL (ref 6–20)
CALCIUM: 9.4 mg/dL (ref 8.9–10.3)
CHLORIDE: 111 mmol/L (ref 101–111)
CO2: 25 mmol/L (ref 22–32)
Creatinine, Ser: 1.05 mg/dL — ABNORMAL HIGH (ref 0.44–1.00)
GFR calc non Af Amer: >60 mL/min (ref >60)
Glucose, Bld: 107 mg/dL — ABNORMAL HIGH (ref 65–99)
Potassium: 4 mmol/L (ref 3.5–5.1)
Sodium: 146 mmol/L — ABNORMAL HIGH (ref 135–145)

## 2015-05-20 LAB — CBC
HCT: 37.9 % (ref 36.0–46.0)
Hemoglobin: 12.9 g/dL (ref 12.0–15.0)
MCH: 31.3 pg (ref 26.0–34.0)
MCHC: 34 g/dL (ref 30.0–36.0)
MCV: 92 fL (ref 78.0–100.0)
Platelets: 356 10*3/uL (ref 150–400)
RBC: 4.12 MIL/uL (ref 3.87–5.11)
RDW: 12.4 % (ref 11.5–15.5)
WBC: 7.7 10*3/uL (ref 4.0–10.5)

## 2015-05-20 LAB — BRAIN NATRIURETIC PEPTIDE: B NATRIURETIC PEPTIDE 5: 12.6 pg/mL (ref 0.0–100.0)

## 2015-05-20 MED ORDER — FUROSEMIDE 40 MG PO TABS
20.0000 mg | ORAL_TABLET | Freq: Once | ORAL | Status: DC
  Filled 2015-05-20: qty 1

## 2015-05-20 MED ORDER — FUROSEMIDE 20 MG PO TABS: 20.0000 mg | ORAL_TABLET | Freq: Every day | ORAL | Status: DC

## 2015-05-20 MED ORDER — HYDROCODONE-ACETAMINOPHEN 5-325 MG PO TABS
1.0000 | ORAL_TABLET | Freq: Once | ORAL | Status: AC
  Administered 2015-05-20: 1 via ORAL
  Filled 2015-05-20: qty 1

## 2015-05-20 NOTE — ED Notes (Signed)
Patient left without d/c instructions or Rx. Called and left message on home number, mobile number does not have voicemail set up.

## 2015-05-20 NOTE — ED Provider Notes (Signed)
Since seen and evaluated. Unimpressive exam perhaps trace to minimal swelling. Normal appearance. Normal perfusion. Plan will be labs and likely reassurance. Would recommend compressive stockings and feet elevation.  Rolland PorterMark Leeza Heiner, MD 05/20/15 [phone]

## 2015-05-20 NOTE — ED Notes (Signed)
Bed: WA23 Expected date:  Expected time:  Means of arrival:  Comments: EMS foot pain 

## 2015-05-20 NOTE — ED Provider Notes (Signed)
CSN: [id number]     Arrival date & time 05/20/15  1631 History   First MD Initiated Contact with Patient 05/20/15 1639     Chief Complaint  Patient presents with  . Foot Swelling     (Consider location/radiation/quality/duration/timing/severity/associated sxs/prior Treatment) The history is provided by the patient and medical records. No language interpreter was used.     Bonnie Kelly is a 40 y.o. female  with a hx of HTN, anemia, anxiety presents to the Emergency Department complaining of gradual, persistent, progressively worsening bilateral foot pain and swelling  Onset 2 days ago.  Pt reports the pain began bilaterally and distally at the base of all 5 toes and has migrated to the proximal ankles.  Pt denies CP, SOB, abd pain, N/V/D.  She denies hx of DVT, long trips, periods of immobilization, estrogen usage, or recent fracture/surgery. Walking and palpation makes it worse and nothing makes it better.  Pt reports taking tylenol and ibuprofen without relief.   Pt denies hx of gout, trauma or CHF.     Past Medical History  Diagnosis Date  . Closed fracture of right tibia and fibula 07/28/2011  . Wears glasses   . Painful orthopaedic hardware 11/19/2012  . Hypertension   . Anemia   . Anxiety    Past Surgical History  Procedure Laterality Date  . Birth control implant in l arm Left 11/2011    implanon  . Tibia im nail insertion  07/28/2011    Procedure: INTRAMEDULLARY (IM) NAIL TIBIAL;  Surgeon: Eulas Post, MD;  Location: [facility] OR;  Service: Orthopedics;  Laterality: Right;  . Hardware removal Right 11/19/2012    Procedure: HARDWARE REMOVAL RIGHT TIBIAL NAIL AND SCREWS;  Surgeon: Eulas Post, MD;  Location: [facility];  Service: Orthopedics;  Laterality: Right;  . Fracture surgery    . Dilation and curettage of uterus  1997   Family History  Problem Relation Age of Onset  . Diabetes Mother   . Hypertension Mother   . Heart failure Mother    Social  History  Substance Use Topics  . Smoking status: Current Every Day Smoker -- 1.00 packs/day for 27 years    Types: Cigarettes  . Smokeless tobacco: Never Used  . Alcohol Use: Yes     Comment: 07/21/2014 "I'll have a couple drinks q 6 months"   OB History    No data available     Review of Systems  Constitutional: Negative for fever, diaphoresis, appetite change, fatigue and unexpected weight change.  HENT: Negative for mouth sores.   Eyes: Negative for visual disturbance.  Respiratory: Negative for cough, chest tightness, shortness of breath and wheezing.   Cardiovascular: Positive for leg swelling (bilateral feet). Negative for chest pain.  Gastrointestinal: Negative for nausea, vomiting, abdominal pain, diarrhea and constipation.  Endocrine: Negative for polydipsia, polyphagia and polyuria.  Genitourinary: Negative for dysuria, urgency, frequency and hematuria.  Musculoskeletal: Positive for arthralgias (bilateral feet). Negative for back pain and neck stiffness.  Skin: Positive for color change (redness). Negative for rash.  Allergic/Immunologic: Negative for immunocompromised state.  Neurological: Negative for syncope, light-headedness and headaches.  Hematological: Does not bruise/bleed easily.  Psychiatric/Behavioral: Negative for sleep disturbance. The patient is not nervous/anxious.       Allergies  Review of patient's allergies indicates no known allergies.  Home Medications   Prior to Admission medications   Medication Sig Start Date End Date Taking? Authorizing Provider  amitriptyline (ELAVIL) 100 MG tablet Take  100 mg by mouth at bedtime.   Yes Historical Provider, MD  amLODipine (NORVASC) 10 MG tablet Take 10 mg by mouth daily.   Yes Historical Provider, MD  butalbital-acetaminophen-caffeine (FIORICET, ESGIC) 50-325-40 MG tablet Take 1 tablet by mouth daily as needed for headache or migraine.   Yes Historical Provider, MD  etonogestrel (IMPLANON) 68 MG IMPL  implant Inject 1 each into the skin once.   Yes Historical Provider, MD  LORazepam (ATIVAN) 1 MG tablet Take 1 tablet (1 mg total) by mouth 2 (two) times daily as needed for anxiety. Patient taking differently: Take 1 mg by mouth 2 (two) times daily.  07/24/14  Yes Otis BraceMarjan Rabbani, MD  pravastatin (PRAVACHOL) 20 MG tablet Take 20 mg by mouth at bedtime.   Yes Historical Provider, MD  furosemide (LASIX) 20 MG tablet Take 1 tablet (20 mg total) by mouth daily. 05/20/15   Soni Kegel, PA-C   BP 132/90 mmHg  Pulse 95  Temp(Src) 98.8 F (37.1 C) (Oral)  Resp 24  SpO2 99% Physical Exam  Constitutional: She appears well-developed and well-nourished. No distress.  Awake, alert, nontoxic appearance  HENT:  Head: Normocephalic and atraumatic.  Mouth/Throat: Oropharynx is clear and moist. No oropharyngeal exudate.  Eyes: Conjunctivae are normal. No scleral icterus.  Neck: Normal range of motion. Neck supple.  Cardiovascular: Normal rate, regular rhythm and intact distal pulses.   Pulmonary/Chest: Effort normal and breath sounds normal. No respiratory distress. She has no wheezes.  Equal chest expansion  Abdominal: Soft. Bowel sounds are normal. She exhibits no mass. There is no tenderness. There is no rebound and no guarding.  Musculoskeletal: Normal range of motion. She exhibits edema ( Trace, nonpitting edema).  Range of motion of all toes on the bilateral feet Full range of motion of bilateral ankles Tender to palpation throughout the entire foot and ankle  Neurological: She is alert.  Speech is clear and goal oriented Moves extremities without ataxia  Skin: Skin is warm and dry. She is not diaphoretic.  Skin has been dyed red with hair dye, no induration, open wounds, increased warmth or signs of cellulitis  Psychiatric: She has a normal mood and affect.  Nursing note and vitals reviewed.   ED Course  Procedures (including critical care time) Labs Review Labs Reviewed  BASIC  METABOLIC PANEL - Abnormal; Notable for the following:    Sodium 146 (*)    Glucose, Bld 107 (*)    Creatinine, Ser 1.05 (*)    All other components within normal limits  CBC  BRAIN NATRIURETIC PEPTIDE    Imaging Review Dg Chest 2 View  05/20/2015  CLINICAL DATA:  Lower extremity swelling EXAM: CHEST  2 VIEW COMPARISON:  08/21/2010 chest radiograph. FINDINGS: Stable cardiomediastinal silhouette with normal heart size. No pneumothorax. No pleural effusion. No pulmonary edema. Mild bibasilar scarring versus atelectasis. No acute consolidative airspace disease. IMPRESSION: Mild bibasilar scarring versus atelectasis. Otherwise no active disease in the chest. Electronically Signed   By: Delbert PhenixJason A Poff M.D.   On: 05/20/2015 18:12   I have personally reviewed and evaluated these images and lab results as part of my medical decision-making.   EKG Interpretation   Date/Time:  Saturday May 20 2015 17:28:55 EDT Ventricular Rate:  106 PR Interval:  126 QRS Duration: 72 QT Interval:  405 QTC Calculation: 538 R Axis:   65 Text Interpretation:  Sinus tachycardia Borderline repolarization  abnormality Prolonged QT interval Confirmed by Fayrene FearingJAMES  MD, MARK ([id number]) on  05/20/2015 11:47:18  PM       MDM   Final diagnoses:  Peripheral edema   Cherrie R Calma presents with trace bilateral peripheral edema and c/o redness to the feet.  No pitting edema. Mild tenderness to palpation along the entirety of the feet. No swollen joints to suggest gout. Mild erythema is noted however it is pink in color. Patient admits to dying her hair pain 2 days ago and her hands are stained the same color as her feet.  There is no indication of cellulitis or infection. No wounds to her feet. Patient is ambulatory.  Signs are stable. Labs are reassuring.  Elevation in serum creatinine noted at 1.05. Discussed with patient importance of water hydration and recheck of her labs with her primary care physician. No elevation in BNP.  Chest x-ray without evidence of pulmonary edema. Clear and equal lung sounds. EKG with sinus tachycardia but no ischemic changes.  Without chest pain or shortness of breath. Highly doubt DVT/PE.  The patient was discussed with and seen by Dr. Fayrene Fearing who agrees with the treatment plan.   Dahlia Client Asli Tokarski, PA-C 05/20/15 2359  Rolland Porter, MD 05/29/15 [phone]

## 2015-05-20 NOTE — Discharge Instructions (Signed)
1. Medications: lasix x 3 days, usual home medications 2. Treatment: rest, drink plenty of fluids, elevate legs, use compression stockings 3. Follow Up: Please followup with your primary doctor in 2-3 days for discussion of your diagnoses and further evaluation after today's visit; if you do not have a primary care doctor use the resource guide provided to find one; Please return to the ER for worsening symptoms    Peripheral Edema You have swelling in your legs (peripheral edema). This swelling is due to excess accumulation of salt and water in your body. Edema may be a sign of heart, kidney or liver disease, or a side effect of a medication. It may also be due to problems in the leg veins. Elevating your legs and using special support stockings may be very helpful, if the cause of the swelling is due to poor venous circulation. Avoid long periods of standing, whatever the cause. Treatment of edema depends on identifying the cause. Chips, pretzels, pickles and other salty foods should be avoided. Restricting salt in your diet is almost always needed. Water pills (diuretics) are often used to remove the excess salt and water from your body via urine. These medicines prevent the kidney from reabsorbing sodium. This increases urine flow. Diuretic treatment may also result in lowering of potassium levels in your body. Potassium supplements may be needed if you have to use diuretics daily. Daily weights can help you keep track of your progress in clearing your edema. You should call your caregiver for follow up care as recommended. SEEK IMMEDIATE MEDICAL CARE IF:   You have increased swelling, pain, redness, or heat in your legs.  You develop shortness of breath, especially when lying down.  You develop chest or abdominal pain, weakness, or fainting.  You have a fever.   This information is not intended to replace advice given to you by your health care provider. Make sure you discuss any questions  you have with your health care provider.   Document Released: 02/22/2004 Document Revised: 04/08/2011 Document Reviewed: 07/27/2014 Elsevier Interactive Patient Education [employer]2016 [employer]

## 2015-05-20 NOTE — ED Notes (Signed)
She c/o "I just noticed my feet were 'puffy' and red two days ago and it got so bad I came here to be checked".  She c/o no other issues/illnesses currently.

## 2015-10-08 ENCOUNTER — Encounter (HOSPITAL_COMMUNITY): Payer: Self-pay | Admitting: Vascular Surgery

## 2015-10-08 ENCOUNTER — Emergency Department (HOSPITAL_COMMUNITY)
Admission: EM | Admit: 2015-10-08 | Discharge: 2015-10-09 | Disposition: A | Discharge status: Home / Self Care | Payer: Medicaid Other | Attending: Emergency Medicine | Admitting: Emergency Medicine

## 2015-10-08 DIAGNOSIS — F17210 Nicotine dependence, cigarettes, uncomplicated: Secondary | ICD-10-CM | POA: Insufficient documentation

## 2015-10-08 DIAGNOSIS — I10 Essential (primary) hypertension: Secondary | ICD-10-CM | POA: Insufficient documentation

## 2015-10-08 DIAGNOSIS — N12 Tubulo-interstitial nephritis, not specified as acute or chronic: Secondary | ICD-10-CM | POA: Insufficient documentation

## 2015-10-08 DIAGNOSIS — R109 Unspecified abdominal pain: Secondary | ICD-10-CM

## 2015-10-08 LAB — CBC
HEMATOCRIT: 42.1 % (ref 36.0–46.0)
HEMOGLOBIN: 14.4 g/dL (ref 12.0–15.0)
MCH: 30.6 pg (ref 26.0–34.0)
MCHC: 34.2 g/dL (ref 30.0–36.0)
MCV: 89.4 fL (ref 78.0–100.0)
Platelets: 321 10*3/uL (ref 150–400)
RBC: 4.71 MIL/uL (ref 3.87–5.11)
RDW: 13 % (ref 11.5–15.5)
WBC: 8.4 10*3/uL (ref 4.0–10.5)

## 2015-10-08 LAB — URINALYSIS, ROUTINE W REFLEX MICROSCOPIC
BILIRUBIN URINE: NEGATIVE
Glucose, UA: NEGATIVE mg/dL
Ketones, ur: NEGATIVE mg/dL
Nitrite: NEGATIVE
PROTEIN: 30 mg/dL — AB
Specific Gravity, Urine: 1.016 (ref 1.005–1.030)
pH: 6 (ref 5.0–8.0)

## 2015-10-08 LAB — BASIC METABOLIC PANEL
ANION GAP: 10 (ref 5–15)
BUN: 7 mg/dL (ref 6–20)
CHLORIDE: 107 mmol/L (ref 101–111)
CO2: 19 mmol/L — AB (ref 22–32)
Calcium: 9 mg/dL (ref 8.9–10.3)
Creatinine, Ser: 0.96 mg/dL (ref 0.44–1.00)
GFR calc Af Amer: >60 mL/min (ref >60)
GFR calc non Af Amer: >60 mL/min (ref >60)
GLUCOSE: 118 mg/dL — AB (ref 65–99)
POTASSIUM: 3.7 mmol/L (ref 3.5–5.1)
Sodium: 136 mmol/L (ref 135–145)

## 2015-10-08 LAB — URINE MICROSCOPIC-ADD ON

## 2015-10-08 LAB — TROPONIN I: Troponin I: <0.03 ng/mL (ref <0.03)

## 2015-10-08 MED ORDER — OXYCODONE-ACETAMINOPHEN 5-325 MG PO TABS
ORAL_TABLET | ORAL | Status: AC
  Filled 2015-10-08: qty 1

## 2015-10-08 MED ORDER — OXYCODONE-ACETAMINOPHEN 5-325 MG PO TABS
1.0000 | ORAL_TABLET | ORAL | Status: DC | PRN
  Administered 2015-10-08: 1 via ORAL

## 2015-10-08 MED ORDER — AMOXICILLIN-POT CLAVULANATE 875-125 MG PO TABS
1.0000 | ORAL_TABLET | Freq: Once | ORAL | Status: AC
  Administered 2015-10-09: 1 via ORAL
  Filled 2015-10-08: qty 1

## 2015-10-08 MED ORDER — KETOROLAC TROMETHAMINE 30 MG/ML IJ SOLN
30.0000 mg | Freq: Once | INTRAMUSCULAR | Status: AC
  Administered 2015-10-09: 30 mg via INTRAVENOUS
  Filled 2015-10-08: qty 1

## 2015-10-08 MED ORDER — SODIUM CHLORIDE 0.9 % IV BOLUS (SEPSIS)
1000.0000 mL | Freq: Once | INTRAVENOUS | Status: AC
  Administered 2015-10-09: 1000 mL via INTRAVENOUS

## 2015-10-08 NOTE — ED Provider Notes (Signed)
[facility]-EMERGENCY DEPT Provider Note   CSN: [id number] Arrival date & time: 10/08/15  2133     History   Chief Complaint Chief Complaint  Patient presents with  . Flank Pain    HPI Bonnie Kelly is a 40 y.o. female.  HPI 40 year old female who presents with flank pain. History of anxiety, HTN, and pyelonephritis. In usual state of health. This morning with left sided back pain and nausea. Over 1-2 days with some loose stools, dysuria, and urinary frequency. No hematuria, vomiting, fever or chills. Unsure if this is similar to prior history of pyelonephritis. No h/o nephrolithiasis.  No cough, dyspnea, chest pain.   Past Medical History:  Diagnosis Date  . Anemia   . Anxiety   . Closed fracture of right tibia and fibula 07/28/2011  . Hypertension   . Painful orthopaedic hardware 11/19/2012  . Wears glasses     Patient Active Problem List   Diagnosis Date Noted  . Prediabetes 07/22/2014  . Tobacco use 07/22/2014  . Obesity 07/22/2014  . Pyelonephritis 07/21/2014  . Painful orthopaedic hardware 11/19/2012  . Closed fracture of right tibia and fibula 07/28/2011    Past Surgical History:  Procedure Laterality Date  . Birth Control Implant in L arm Left 11/2011   implanon  . DILATION AND CURETTAGE OF UTERUS  1997  . FRACTURE SURGERY    . HARDWARE REMOVAL Right 11/19/2012   Procedure: HARDWARE REMOVAL RIGHT TIBIAL NAIL AND SCREWS;  Surgeon: Eulas PostJoshua P Landau, MD;  Location: [facility];  Service: Orthopedics;  Laterality: Right;  . TIBIA IM NAIL INSERTION  07/28/2011   Procedure: INTRAMEDULLARY (IM) NAIL TIBIAL;  Surgeon: Eulas PostJoshua P Landau, MD;  Location: [facility] OR;  Service: Orthopedics;  Laterality: Right;    OB History    No data available       Home Medications    Prior to Admission medications   Medication Sig Start Date End Date Taking? Authorizing Provider  amitriptyline (ELAVIL) 100 MG tablet Take 100 mg by mouth at bedtime.    Historical Provider,  MD  amLODipine (NORVASC) 10 MG tablet Take 10 mg by mouth daily.    Historical Provider, MD  amoxicillin-clavulanate (AUGMENTIN) 875-125 MG tablet Take 1 tablet by mouth every 12 (twelve) hours. 10/09/15   Lavera Guiseana Duo Sherrise Liberto, MD  butalbital-acetaminophen-caffeine (FIORICET, ESGIC) [phone] MG tablet Take 1 tablet by mouth daily as needed for headache or migraine.    Historical Provider, MD  etonogestrel (IMPLANON) 68 MG IMPL implant Inject 1 each into the skin once.    Historical Provider, MD  furosemide (LASIX) 20 MG tablet Take 1 tablet (20 mg total) by mouth daily. 05/20/15   Hannah Muthersbaugh, PA-C  HYDROcodone-acetaminophen (NORCO/VICODIN) 5-325 MG tablet Take 1-2 tablets by mouth every 4 (four) hours as needed. 10/09/15   Lavera Guiseana Duo Gypsy Kellogg, MD  ibuprofen (ADVIL,MOTRIN) 600 MG tablet Take 1 tablet (600 mg total) by mouth every 6 (six) hours as needed. 10/09/15   Lavera Guiseana Duo Mikeisha Lemonds, MD  LORazepam (ATIVAN) 1 MG tablet Take 1 tablet (1 mg total) by mouth 2 (two) times daily as needed for anxiety. Patient taking differently: Take 1 mg by mouth 2 (two) times daily.  07/24/14   Otis BraceMarjan Rabbani, MD  pravastatin (PRAVACHOL) 20 MG tablet Take 20 mg by mouth at bedtime.    Historical Provider, MD    Family History Family History  Problem Relation Age of Onset  . Diabetes Mother   . Hypertension Mother   .  Heart failure Mother     Social History Social History  Substance Use Topics  . Smoking status: Current Every Day Smoker    Packs/day: 1.00    Years: 27.00    Types: Cigarettes  . Smokeless tobacco: Never Used  . Alcohol use Yes     Comment: 07/21/2014 "I'll have a couple drinks q 6 months"     Allergies   Review of patient's allergies indicates no known allergies.   Review of Systems Review of Systems 10/14 systems reviewed and are negative other than those stated in the HPI   Physical Exam Updated Vital Signs BP 125/97   Pulse 94   Temp 99 F (37.2 C) (Oral)   Resp 16   SpO2 97%    Physical Exam Physical Exam  Nursing note and vitals reviewed. Constitutional: Well developed, well nourished, non-toxic, and in no acute distress Head: Normocephalic and atraumatic.  Mouth/Throat: Oropharynx is clear and moist.  Neck: Normal range of motion. Neck supple.  Cardiovascular: Normal rate and regular rhythm.   Pulmonary/Chest: Effort normal and breath sounds normal.  Abdominal: Soft. There is no tenderness. There is no rebound and no guarding. Left flank tenderness to percussion and palpation. Musculoskeletal: Normal range of motion.  Neurological: Alert, no facial droop, fluent speech, moves all extremities symmetrically Skin: Skin is warm and dry.  Psychiatric: Cooperative   ED Treatments / Results  Labs (all labs ordered are listed, but only abnormal results are displayed) Labs Reviewed  URINALYSIS, ROUTINE W REFLEX MICROSCOPIC (NOT AT [facility]) - Abnormal; Notable for the following:       Result Value   APPearance CLOUDY (*)    Hgb urine dipstick SMALL (*)    Protein, ur 30 (*)    Leukocytes, UA SMALL (*)    All other components within normal limits  BASIC METABOLIC PANEL - Abnormal; Notable for the following:    CO2 19 (*)    Glucose, Bld 118 (*)    All other components within normal limits  URINE MICROSCOPIC-ADD ON - Abnormal; Notable for the following:    Squamous Epithelial / LPF 6-30 (*)    Bacteria, UA FEW (*)    All other components within normal limits  URINE CULTURE  CBC  TROPONIN I  PREGNANCY, URINE    EKG  EKG Interpretation  Date/Time:  Sunday October 08 2015 21:49:31 EDT Ventricular Rate:  130 PR Interval:  126 QRS Duration: 74 QT Interval:  302 QTC Calculation: 444 R Axis:   45 Text Interpretation:  Sinus tachycardia Nonspecific ST abnormality Abnormal ECG Confirmed by Caellum Mancil MD, Linet Brash ([id number]) on 10/08/2015 11:21:24 PM       Radiology No results found.  Procedures Procedures (including critical care time)  Medications Ordered in  ED Medications  oxyCODONE-acetaminophen (PERCOCET/ROXICET) 5-325 MG per tablet 1 tablet (1 tablet Oral Given 10/08/15 2150)  sodium chloride 0.9 % bolus 1,000 mL (1,000 mLs Intravenous New Bag/Given 10/09/15 0001)  ketorolac (TORADOL) 30 MG/ML injection 30 mg (30 mg Intravenous Given 10/09/15 0001)  amoxicillin-clavulanate (AUGMENTIN) 875-125 MG per tablet 1 tablet (1 tablet Oral Given 10/09/15 0002)     Initial Impression / Assessment and Plan / ED Course  I have reviewed the triage vital signs and the nursing notes.  Pertinent labs & imaging results that were available during my care of the patient were reviewed by me and considered in my medical decision making (see chart for details).  Clinical Course   40 year old female who presents  with urinary complaints and left flank pain. Afebrile, but tachycardic. With soft and benign abdomen. Left flank tenderness to palpation, ? MSK component but in setting of urinary complaints c/f pyelonephritis. With no leukocytosis. UA with some leukocytes, WBCs and bacteria. Given clinical symptoms will treat for pyelonephritis. Appropriate for outpatient management. Urine culture sensitive to augmentin and treated successfully with 14 course Augmentin in past. Will treat for same. Received IVF and toradol. Tachycardia resolved after analgesics and IVF. She feels better. Stable for discharge home. Strict return and follow-up instructions reviewed. She expressed understanding of all discharge instructions and felt comfortable with the plan of care.    Final Clinical Impressions(s) / ED Diagnoses   Final diagnoses:  Flank pain  Pyelonephritis    New Prescriptions New Prescriptions   AMOXICILLIN-CLAVULANATE (AUGMENTIN) 875-125 MG TABLET    Take 1 tablet by mouth every 12 (twelve) hours.   HYDROCODONE-ACETAMINOPHEN (NORCO/VICODIN) 5-325 MG TABLET    Take 1-2 tablets by mouth every 4 (four) hours as needed.   IBUPROFEN (ADVIL,MOTRIN) 600 MG TABLET    Take 1  tablet (600 mg total) by mouth every 6 (six) hours as needed.     Lavera Guise, MD 10/09/15 [phone]

## 2015-10-08 NOTE — ED Triage Notes (Signed)
Pt reports to the ED for eval of left flank pain. She reports some associated nausea and lightheadedness. She has hx of pyelonephritis and sepsis. She reports the pain is constant. Denies any hematuria. Pt reports some dysuria and urinary frequency and urgency.

## 2015-10-09 MED ORDER — AMOXICILLIN-POT CLAVULANATE 875-125 MG PO TABS: 1.0000 | ORAL_TABLET | Freq: Two times a day (BID) | ORAL | 0 refills | Status: DC

## 2015-10-09 MED ORDER — IBUPROFEN 600 MG PO TABS: 600.0000 mg | ORAL_TABLET | Freq: Four times a day (QID) | ORAL | 0 refills | Status: DC | PRN

## 2015-10-09 MED ORDER — HYDROCODONE-ACETAMINOPHEN 5-325 MG PO TABS: 1.0000 | ORAL_TABLET | ORAL | 0 refills | Status: DC | PRN

## 2015-10-09 NOTE — Discharge Instructions (Signed)
Take antibiotics as prescribed.  Return without fail for worsening symptoms, including fever, intractable vomiting, severe abdominal pain or any other symptoms concerning to you.

## 2015-10-10 LAB — URINE CULTURE: CULTURE: NO GROWTH

## 2015-11-04 ENCOUNTER — Encounter (HOSPITAL_COMMUNITY): Payer: Self-pay | Admitting: *Deleted

## 2015-11-04 DIAGNOSIS — I10 Essential (primary) hypertension: Secondary | ICD-10-CM | POA: Insufficient documentation

## 2015-11-04 DIAGNOSIS — R1010 Upper abdominal pain, unspecified: Secondary | ICD-10-CM | POA: Insufficient documentation

## 2015-11-04 DIAGNOSIS — Z79899 Other long term (current) drug therapy: Secondary | ICD-10-CM | POA: Insufficient documentation

## 2015-11-04 DIAGNOSIS — F17210 Nicotine dependence, cigarettes, uncomplicated: Secondary | ICD-10-CM | POA: Insufficient documentation

## 2015-11-04 LAB — POC URINE PREG, ED: PREG TEST UR: NEGATIVE

## 2015-11-04 LAB — CBC
HCT: 41.6 % (ref 36.0–46.0)
Hemoglobin: 14.1 g/dL (ref 12.0–15.0)
MCH: 30.2 pg (ref 26.0–34.0)
MCHC: 33.9 g/dL (ref 30.0–36.0)
MCV: 89.1 fL (ref 78.0–100.0)
PLATELETS: 331 10*3/uL (ref 150–400)
RBC: 4.67 MIL/uL (ref 3.87–5.11)
RDW: 13.2 % (ref 11.5–15.5)
WBC: 9.6 10*3/uL (ref 4.0–10.5)

## 2015-11-04 NOTE — ED Triage Notes (Signed)
The pt is c/o abd pain for 2 days with n and v  Her pain is worse today  She has had similar symptoms previously that is associated wsith chronic kidney disease.  lmp implant

## 2015-11-05 ENCOUNTER — Emergency Department (HOSPITAL_COMMUNITY)
Admission: EM | Admit: 2015-11-05 | Discharge: 2015-11-05 | Disposition: A | Discharge status: Home / Self Care | Payer: Medicaid Other | Attending: Emergency Medicine | Admitting: Emergency Medicine

## 2015-11-05 ENCOUNTER — Emergency Department (HOSPITAL_COMMUNITY): Payer: Medicaid Other

## 2015-11-05 DIAGNOSIS — R109 Unspecified abdominal pain: Secondary | ICD-10-CM

## 2015-11-05 LAB — COMPREHENSIVE METABOLIC PANEL
ALBUMIN: 4 g/dL (ref 3.5–5.0)
ALT: 27 U/L (ref 14–54)
ANION GAP: 10 (ref 5–15)
AST: 23 U/L (ref 15–41)
Alkaline Phosphatase: 49 U/L (ref 38–126)
BILIRUBIN TOTAL: 0.6 mg/dL (ref 0.3–1.2)
BUN: 14 mg/dL (ref 6–20)
CO2: 19 mmol/L — ABNORMAL LOW (ref 22–32)
Calcium: 8.9 mg/dL (ref 8.9–10.3)
Chloride: 107 mmol/L (ref 101–111)
Creatinine, Ser: 1.13 mg/dL — ABNORMAL HIGH (ref 0.44–1.00)
GFR, EST NON AFRICAN AMERICAN: 59 mL/min — AB (ref >60)
Glucose, Bld: 160 mg/dL — ABNORMAL HIGH (ref 65–99)
POTASSIUM: 3.3 mmol/L — AB (ref 3.5–5.1)
Sodium: 136 mmol/L (ref 135–145)
TOTAL PROTEIN: 7.4 g/dL (ref 6.5–8.1)

## 2015-11-05 LAB — URINALYSIS, ROUTINE W REFLEX MICROSCOPIC
Bilirubin Urine: NEGATIVE
Glucose, UA: NEGATIVE mg/dL
Hgb urine dipstick: NEGATIVE
Ketones, ur: 15 mg/dL — AB
LEUKOCYTES UA: NEGATIVE
NITRITE: NEGATIVE
PROTEIN: 30 mg/dL — AB
Specific Gravity, Urine: >1.03 — ABNORMAL HIGH (ref 1.005–1.030)
pH: 5 (ref 5.0–8.0)

## 2015-11-05 LAB — URINE MICROSCOPIC-ADD ON

## 2015-11-05 LAB — LIPASE, BLOOD: Lipase: 32 U/L (ref 11–51)

## 2015-11-05 MED ORDER — MORPHINE SULFATE (PF) 4 MG/ML IV SOLN
4.0000 mg | Freq: Once | INTRAVENOUS | Status: AC
  Administered 2015-11-05: 4 mg via INTRAVENOUS
  Filled 2015-11-05: qty 1

## 2015-11-05 MED ORDER — GI COCKTAIL ~~LOC~~
30.0000 mL | Freq: Once | ORAL | Status: AC
  Administered 2015-11-05: 30 mL via ORAL
  Filled 2015-11-05: qty 30

## 2015-11-05 MED ORDER — SODIUM CHLORIDE 0.9 % IV SOLN
1000.0000 mL | Freq: Once | INTRAVENOUS | Status: AC
  Administered 2015-11-05: 1000 mL via INTRAVENOUS

## 2015-11-05 MED ORDER — SODIUM CHLORIDE 0.9 % IV SOLN
1000.0000 mL | INTRAVENOUS | Status: DC
  Administered 2015-11-05: 1000 mL via INTRAVENOUS

## 2015-11-05 MED ORDER — OXYCODONE-ACETAMINOPHEN 5-325 MG PO TABS: 1.0000 | ORAL_TABLET | ORAL | 0 refills | Status: AC | PRN

## 2015-11-05 MED ORDER — ONDANSETRON 4 MG PO TBDP
8.0000 mg | ORAL_TABLET | Freq: Once | ORAL | Status: DC
  Filled 2015-11-05: qty 2

## 2015-11-05 MED ORDER — ONDANSETRON 4 MG PO TBDP
4.0000 mg | ORAL_TABLET | Freq: Once | ORAL | Status: AC
  Administered 2015-11-05: 4 mg via ORAL

## 2015-11-05 MED ORDER — PANTOPRAZOLE SODIUM 40 MG PO TBEC
40.0000 mg | DELAYED_RELEASE_TABLET | Freq: Once | ORAL | Status: AC
  Administered 2015-11-05: 40 mg via ORAL
  Filled 2015-11-05: qty 1

## 2015-11-05 MED ORDER — PANTOPRAZOLE SODIUM 40 MG PO TBEC: 40.0000 mg | DELAYED_RELEASE_TABLET | Freq: Every day | ORAL | 0 refills | Status: DC

## 2015-11-05 NOTE — ED Notes (Signed)
Pt requesting more pain medication, MD notified

## 2015-11-05 NOTE — ED Notes (Signed)
MD at bedside. 

## 2015-11-05 NOTE — ED Notes (Signed)
Pt reports pain worse after PO intact. Passed PO challenge

## 2015-11-05 NOTE — ED Provider Notes (Signed)
[facility] DEPT Provider Note   CSN: [id number] Arrival date & time: 11/04/15  2307   By signing my name below, I, Clarisse GougeXavier Herndon, attest that this documentation has been prepared under the direction and in the presence of Dione Boozeavid Aaliya Maultsby, MD. Electronically signed, Clarisse GougeXavier Herndon, ED Scribe. 11/05/15. 2:29 AM.   History   Chief Complaint Chief Complaint  Patient presents with  . Abdominal Pain    The history is provided by the patient. No language interpreter was used.    HPI Comments: Bonnie Kelly is a 40 y.o. female with a PMHx of HTN who presents to the Emergency Department complaining of constant, gradual worsening epigastric abdominal pain over the past few days. Pt describes pain as sharp and rates 7/10. The pain radiates to the right side of her back. Deep breathing, eating, and lying down exacerbate her pain. No alleviatating factors. Pt also reports associated nausea and multiple episodes of vomiting. She admits nausea improved after receiving medication prior to evaluation. No recent fever or chills.  Past Medical History:  Diagnosis Date  . Anemia   . Anxiety   . Closed fracture of right tibia and fibula 07/28/2011  . Hypertension   . Painful orthopaedic hardware (HCC) 11/19/2012  . Wears glasses     Patient Active Problem List   Diagnosis Date Noted  . Prediabetes 07/22/2014  . Tobacco use 07/22/2014  . Obesity 07/22/2014  . Pyelonephritis 07/21/2014  . Painful orthopaedic hardware (HCC) 11/19/2012  . Closed fracture of right tibia and fibula 07/28/2011    Past Surgical History:  Procedure Laterality Date  . Birth Control Implant in L arm Left 11/2011   implanon  . DILATION AND CURETTAGE OF UTERUS  1997  . FRACTURE SURGERY    . HARDWARE REMOVAL Right 11/19/2012   Procedure: HARDWARE REMOVAL RIGHT TIBIAL NAIL AND SCREWS;  Surgeon: Eulas PostJoshua P Landau, MD;  Location: [facility];  Service: Orthopedics;  Laterality: Right;  . TIBIA IM NAIL  INSERTION  07/28/2011   Procedure: INTRAMEDULLARY (IM) NAIL TIBIAL;  Surgeon: Eulas PostJoshua P Landau, MD;  Location: [facility] OR;  Service: Orthopedics;  Laterality: Right;    OB History    No data available       Home Medications    Prior to Admission medications   Medication Sig Start Date End Date Taking? Authorizing Provider  amLODipine (NORVASC) 10 MG tablet Take 10 mg by mouth daily.   Yes Historical Provider, MD  clonazePAM (KLONOPIN) 1 MG tablet Take 1 mg by mouth 2 (two) times daily.   Yes Historical Provider, MD  eszopiclone (LUNESTA) 2 MG TABS tablet Take 2 mg by mouth at bedtime. Take immediately before bedtime   Yes Historical Provider, MD  etonogestrel (IMPLANON) 68 MG IMPL implant Inject 1 each into the skin once.   Yes Historical Provider, MD  amoxicillin-clavulanate (AUGMENTIN) 875-125 MG tablet Take 1 tablet by mouth every 12 (twelve) hours. Patient not taking: Reported on 11/05/2015 10/09/15   Lavera Guiseana Duo Liu, MD  furosemide (LASIX) 20 MG tablet Take 1 tablet (20 mg total) by mouth daily. Patient not taking: Reported on 11/05/2015 05/20/15   Dahlia ClientHannah Muthersbaugh, PA-C  HYDROcodone-acetaminophen (NORCO/VICODIN) 5-325 MG tablet Take 1-2 tablets by mouth every 4 (four) hours as needed. Patient not taking: Reported on 11/05/2015 10/09/15   Lavera Guiseana Duo Liu, MD  ibuprofen (ADVIL,MOTRIN) 600 MG tablet Take 1 tablet (600 mg total) by mouth every 6 (six) hours as needed. Patient not taking: Reported on 11/05/2015 10/09/15  Lavera Guise, MD  LORazepam (ATIVAN) 1 MG tablet Take 1 tablet (1 mg total) by mouth 2 (two) times daily as needed for anxiety. Patient not taking: Reported on 11/05/2015 07/24/14   Otis Brace, MD    Family History Family History  Problem Relation Age of Onset  . Diabetes Mother   . Hypertension Mother   . Heart failure Mother     Social History Social History  Substance Use Topics  . Smoking status: Current Every Day Smoker    Packs/day: 1.00    Years: 27.00    Types:  Cigarettes  . Smokeless tobacco: Never Used  . Alcohol use Yes     Comment: 07/21/2014 "I'll have a couple drinks q 6 months"     Allergies   Review of patient's allergies indicates no known allergies.   Review of Systems Review of Systems  Constitutional: Negative for chills and fever.  Respiratory: Negative for cough.   Cardiovascular: Negative for chest pain.  Gastrointestinal: Positive for abdominal pain, nausea and vomiting.  Musculoskeletal: Positive for back pain.  All other systems reviewed and are negative.    Physical Exam Updated Vital Signs BP (!) 157/111 (BP Location: Right Wrist)   Pulse 102   Temp 98.3 F (36.8 C) (Oral)   Resp 18   SpO2 98%   Physical Exam  Constitutional: She appears well-developed and well-nourished. No distress.  obese  HENT:  Head: Normocephalic and atraumatic.  Eyes: Conjunctivae are normal.  Neck: Neck supple.  Cardiovascular: Normal rate and regular rhythm.   No murmur heard. Pulmonary/Chest: Effort normal and breath sounds normal. No respiratory distress.  Abdominal: Soft. There is tenderness.  Mild to moderate tenderness across upper abdomen Negative murphy's sign Bowl sounds decreased  Musculoskeletal: She exhibits no edema.  Neurological: She is alert.  Skin: Skin is warm and dry.  Psychiatric: She has a normal mood and affect.  Nursing note and vitals reviewed.    ED Treatments / Results  DIAGNOSTIC STUDIES: Oxygen Saturation is 98% on RA, normal by my interpretation.    COORDINATION OF CARE: 1:52 AM Will order pain medication and imaging. Discussed treatment plan with pt at bedside and pt agreed to plan.   Labs (all labs ordered are listed, but only abnormal results are displayed) Labs Reviewed  COMPREHENSIVE METABOLIC PANEL - Abnormal; Notable for the following:       Result Value   Potassium 3.3 (*)    CO2 19 (*)    Glucose, Bld 160 (*)    Creatinine, Ser 1.13 (*)    GFR calc non Af Amer 59 (*)    All  other components within normal limits  URINALYSIS, ROUTINE W REFLEX MICROSCOPIC (NOT AT [facility]) - Abnormal; Notable for the following:    Specific Gravity, Urine >1.030 (*)    Ketones, ur 15 (*)    Protein, ur 30 (*)    All other components within normal limits  URINE MICROSCOPIC-ADD ON - Abnormal; Notable for the following:    Squamous Epithelial / LPF 6-30 (*)    Bacteria, UA RARE (*)    All other components within normal limits  LIPASE, BLOOD  CBC  POC URINE PREG, ED    Radiology US Abdomen Complete  Result Date: 11/05/2015 CLINICAL DATA:  Epigastric and mid abdominal pain for 3 days with nausea and vomiting for 1 day. Diarrhea last week. EXAM: ABDOMEN ULTRASOUND COMPLETE COMPARISON:  07/21/2014 FINDINGS: Gallbladder: No gallstones or wall thickening visualized. No sonographic Murphy sign  noted by sonographer. Common bile duct: Diameter: 6.3 mm, normal Liver: Diffusely increased hepatic parenchymal echotexture suggesting fatty infiltration. Small complex cysts centrally in the right lobe measuring 1.3 cm maximal diameter. No other focal lesions identified. IVC: No abnormality visualized. Pancreas: Visualized portion unremarkable. Spleen: Size and appearance within normal limits. Right Kidney: Length: 11.9 cm. Echogenicity within normal limits. No mass or hydronephrosis visualized. Left Kidney: Length: 12 cm. Small cyst on the lower pole measuring 1.2 cm maximal diameter. Parenchymal echotexture is otherwise homogeneous and normal. No hydronephrosis. Abdominal aorta: No aneurysm visualized. Other findings: None. IMPRESSION: Small cysts in the liver and left kidney, likely benign. No evidence of cholelithiasis or cholecystitis. Electronically Signed   By: Burman Nieves M.D.   On: 11/05/2015 03:13    Procedures Procedures (including critical care time)  Medications Ordered in ED Medications  0.9 %  sodium chloride infusion (0 mLs Intravenous Stopped 11/05/15 0359)    Followed by  0.9 %   sodium chloride infusion (0 mLs Intravenous Stopped 11/05/15 0556)  ondansetron (ZOFRAN-ODT) disintegrating tablet 4 mg (4 mg Oral Given 11/05/15 0123)  morphine 4 MG/ML injection 4 mg (4 mg Intravenous Given 11/05/15 0214)  morphine 4 MG/ML injection 4 mg (4 mg Intravenous Given 11/05/15 0350)  pantoprazole (PROTONIX) EC tablet 40 mg (40 mg Oral Given 11/05/15 0525)  gi cocktail (Maalox,Lidocaine,Donnatal) (30 mLs Oral Given 11/05/15 0525)     Initial Impression / Assessment and Plan / ED Course  I have reviewed the triage vital signs and the nursing notes.  Pertinent labs & imaging results that were available during my care of the patient were reviewed by me and considered in my medical decision making (see chart for details).  Clinical Course   Upper abdominal pain which is nonspecific. Old records are reviewed and she has prior ED visits for pyelonephritis but not abdominal pain. Screening labs are obtained and are unremarkable. She is sent for abdominal ultrasound which showed no evidence of cholelithiasis. She's given morphine which gave modest relief of pain. She is given a GI cocktail and pantoprazole with significantly better relief of pain. No red flags to suspect serious intra-abdominal pathology. I suspect her pain is related to gastric ulcer. She is discharged with prescription for pantoprazole and also a small number of oxycodone-acetaminophen tablets to use as needed for severe pain. She is referred back to her PCP. If pain persists, may need GI referral for endoscopy.  Final Clinical Impressions(s) / ED Diagnoses   Final diagnoses:  Abdominal pain, unspecified abdominal location    New Prescriptions New Prescriptions   OXYCODONE-ACETAMINOPHEN (PERCOCET) 5-325 MG TABLET    Take 1 tablet by mouth every 4 (four) hours as needed for moderate pain.   PANTOPRAZOLE (PROTONIX) 40 MG TABLET    Take 1 tablet (40 mg total) by mouth daily.     I personally performed the services  described in this documentation, which was scribed in my presence. The recorded information has been reviewed and is accurate.       Dione Booze, MD 11/05/15 [phone]

## 2015-11-05 NOTE — ED Notes (Signed)
Patient left at this time with all belongings. Refused wheelchair escort to lobby.

## 2015-12-07 ENCOUNTER — Encounter: Payer: Self-pay | Admitting: Emergency Medicine

## 2015-12-07 ENCOUNTER — Emergency Department
Admission: EM | Admit: 2015-12-07 | Discharge: 2015-12-07 | Disposition: A | Discharge status: Home / Self Care | Payer: Self-pay | Attending: Emergency Medicine | Admitting: Emergency Medicine

## 2015-12-07 ENCOUNTER — Emergency Department: Payer: Self-pay

## 2015-12-07 DIAGNOSIS — I10 Essential (primary) hypertension: Secondary | ICD-10-CM | POA: Insufficient documentation

## 2015-12-07 DIAGNOSIS — Z79899 Other long term (current) drug therapy: Secondary | ICD-10-CM | POA: Insufficient documentation

## 2015-12-07 DIAGNOSIS — F17210 Nicotine dependence, cigarettes, uncomplicated: Secondary | ICD-10-CM | POA: Insufficient documentation

## 2015-12-07 DIAGNOSIS — R1013 Epigastric pain: Secondary | ICD-10-CM | POA: Insufficient documentation

## 2015-12-07 LAB — CBC
HCT: 41.5 % (ref 35.0–47.0)
Hemoglobin: 14.3 g/dL (ref 12.0–16.0)
MCH: 30.6 pg (ref 26.0–34.0)
MCHC: 34.6 g/dL (ref 32.0–36.0)
MCV: 88.5 fL (ref 80.0–100.0)
PLATELETS: 309 10*3/uL (ref 150–440)
RBC: 4.69 MIL/uL (ref 3.80–5.20)
RDW: 13.4 % (ref 11.5–14.5)
WBC: 8.4 10*3/uL (ref 3.6–11.0)

## 2015-12-07 LAB — URINALYSIS COMPLETE WITH MICROSCOPIC (ARMC ONLY)
BILIRUBIN URINE: NEGATIVE
Bacteria, UA: NONE SEEN
GLUCOSE, UA: NEGATIVE mg/dL
HGB URINE DIPSTICK: NEGATIVE
KETONES UR: NEGATIVE mg/dL
LEUKOCYTES UA: NEGATIVE
Nitrite: NEGATIVE
Protein, ur: 100 mg/dL — AB
Specific Gravity, Urine: 1.029 (ref 1.005–1.030)
pH: 5 (ref 5.0–8.0)

## 2015-12-07 LAB — COMPREHENSIVE METABOLIC PANEL
ALK PHOS: 47 U/L (ref 38–126)
ALT: 27 U/L (ref 14–54)
AST: 25 U/L (ref 15–41)
Albumin: 4.1 g/dL (ref 3.5–5.0)
Anion gap: 9 (ref 5–15)
BILIRUBIN TOTAL: 0.2 mg/dL — AB (ref 0.3–1.2)
BUN: 16 mg/dL (ref 6–20)
CALCIUM: 9.4 mg/dL (ref 8.9–10.3)
CHLORIDE: 107 mmol/L (ref 101–111)
CO2: 24 mmol/L (ref 22–32)
CREATININE: 0.97 mg/dL (ref 0.44–1.00)
GFR calc Af Amer: >60 mL/min (ref >60)
Glucose, Bld: 105 mg/dL — ABNORMAL HIGH (ref 65–99)
Potassium: 3.4 mmol/L — ABNORMAL LOW (ref 3.5–5.1)
Sodium: 140 mmol/L (ref 135–145)
TOTAL PROTEIN: 7.9 g/dL (ref 6.5–8.1)

## 2015-12-07 LAB — LIPASE, BLOOD: Lipase: 33 U/L (ref 11–51)

## 2015-12-07 MED ORDER — HYDROMORPHONE HCL 1 MG/ML IJ SOLN
1.0000 mg | Freq: Once | INTRAMUSCULAR | Status: AC
  Administered 2015-12-07: 1 mg via INTRAMUSCULAR

## 2015-12-07 MED ORDER — PANTOPRAZOLE SODIUM 40 MG PO TBEC: 40.0000 mg | DELAYED_RELEASE_TABLET | Freq: Every day | ORAL | 1 refills | Status: AC

## 2015-12-07 MED ORDER — HYDROMORPHONE HCL 1 MG/ML IJ SOLN
INTRAMUSCULAR | Status: AC
  Filled 2015-12-07: qty 1

## 2015-12-07 MED ORDER — OXYCODONE-ACETAMINOPHEN 5-325 MG PO TABS
1.0000 | ORAL_TABLET | Freq: Once | ORAL | Status: AC
  Administered 2015-12-07: 1 via ORAL
  Filled 2015-12-07: qty 1

## 2015-12-07 MED ORDER — HYDROCODONE-ACETAMINOPHEN 5-325 MG PO TABS: 1.0000 | ORAL_TABLET | ORAL | 0 refills | Status: AC | PRN

## 2015-12-07 NOTE — ED Triage Notes (Signed)
Pt c/o epigastric pain X 1 month. Seen at [facility] and went away but then past few days has been on and off. C/o nausea. Denies vomiting has had diarrhea. NAD. Reports lost 12 lb in 2 week

## 2015-12-07 NOTE — ED Notes (Signed)
Patient transported to Ultrasound 

## 2015-12-07 NOTE — ED Notes (Signed)
AAOx3.  Skin warm and dry. NAD.  Ambulates with easy and steady gait. NAD °

## 2015-12-07 NOTE — ED Provider Notes (Signed)
[facility] Emergency Department Provider Note  Time seen: 7:39 PM  I have reviewed the triage vital signs and the nursing notes.   HISTORY  Chief Complaint Abdominal Pain    HPI Bonnie Kelly is a 40 y.o. female who presents the emergency department with epigastric right upper quadrant pain. According to the patient for the past one month she has intermittently experienced epigastric and right upper quadrant pain. States when the [facility] 1 month ago for a workup that was normal. Today she states she ate lunch around 11:30 this morning and 30 minutes later developed severe epigastric and right upper quadrant pain so she came to the emergency department for evaluation. Patient denies any fever. States nausea but denies any vomiting or diarrhea. States she has had a 12 pound weight loss over the past 2-3 weeks unintentionally.  Currently describes the pain as dull aching pain moderate in severity.  Past Medical History:  Diagnosis Date  . Anemia   . Anxiety   . Closed fracture of right tibia and fibula 07/28/2011  . Hypertension   . Painful orthopaedic hardware (HCC) 11/19/2012  . Wears glasses     Patient Active Problem List   Diagnosis Date Noted  . Prediabetes 07/22/2014  . Tobacco use 07/22/2014  . Obesity 07/22/2014  . Pyelonephritis 07/21/2014  . Painful orthopaedic hardware (HCC) 11/19/2012  . Closed fracture of right tibia and fibula 07/28/2011    Past Surgical History:  Procedure Laterality Date  . Birth Control Implant in L arm Left 11/2011   implanon  . DILATION AND CURETTAGE OF UTERUS  1997  . FRACTURE SURGERY    . HARDWARE REMOVAL Right 11/19/2012   Procedure: HARDWARE REMOVAL RIGHT TIBIAL NAIL AND SCREWS;  Surgeon: Eulas PostJoshua P Landau, MD;  Location: [facility];  Service: Orthopedics;  Laterality: Right;  . TIBIA IM NAIL INSERTION  07/28/2011   Procedure: INTRAMEDULLARY (IM) NAIL TIBIAL;  Surgeon: Eulas PostJoshua P Landau,  MD;  Location: [facility] OR;  Service: Orthopedics;  Laterality: Right;    Prior to Admission medications   Medication Sig Start Date End Date Taking? Authorizing Provider  amLODipine (NORVASC) 10 MG tablet Take 10 mg by mouth daily.    Historical Provider, MD  clonazePAM (KLONOPIN) 1 MG tablet Take 1 mg by mouth 2 (two) times daily.    Historical Provider, MD  eszopiclone (LUNESTA) 2 MG TABS tablet Take 2 mg by mouth at bedtime. Take immediately before bedtime    Historical Provider, MD  etonogestrel (IMPLANON) 68 MG IMPL implant Inject 1 each into the skin once.    Historical Provider, MD  oxyCODONE-acetaminophen (PERCOCET) 5-325 MG tablet Take 1 tablet by mouth every 4 (four) hours as needed for moderate pain. 11/05/15   Dione Boozeavid Glick, MD  pantoprazole (PROTONIX) 40 MG tablet Take 1 tablet (40 mg total) by mouth daily. 11/05/15   Dione Boozeavid Glick, MD    No Known Allergies  Family History  Problem Relation Age of Onset  . Diabetes Mother   . Hypertension Mother   . Heart failure Mother     Social History Social History  Substance Use Topics  . Smoking status: Current Every Day Smoker    Packs/day: 1.00    Years: 27.00    Types: Cigarettes  . Smokeless tobacco: Never Used  . Alcohol use Yes     Comment: 07/21/2014 "I'll have a couple drinks q 6 months"    Review of Systems Constitutional: Negative for fever. Cardiovascular:  Negative for chest pain. Respiratory: Negative for shortness of breath. Gastrointestinal: Epigastric/right upper quadrant pain. Genitourinary: Negative for dysuria. Neurological: Negative for headache 10-point ROS otherwise negative.  ____________________________________________   PHYSICAL EXAM:  VITAL SIGNS: ED Triage Vitals  Enc Vitals Group     BP 12/07/15 1745 (!) 124/98     Pulse Rate 12/07/15 1745 (!) 105     Resp 12/07/15 1745 20     Temp 12/07/15 1745 98.5 F (36.9 C)     Temp Source 12/07/15 1745 Oral     SpO2 12/07/15 1745 100 %     Weight  12/07/15 1745 270 lb (122.5 kg)     Height 12/07/15 1745 5\' 11"  (1.803 m)     Head Circumference --      Peak Flow --      Pain Score 12/07/15 1746 8     Pain Loc --      Pain Edu? --      Excl. in GC? --     Constitutional: Alert and oriented. Well appearing and in no distress. Eyes: Normal exam ENT   Head: Normocephalic and atraumatic.   Mouth/Throat: Mucous membranes are moist. Cardiovascular: Normal rate, regular rhythm. No murmur Respiratory: Normal respiratory effort without tachypnea nor retractions. Breath sounds are clear  Gastrointestinal: Soft and nontender. No distention. Musculoskeletal: Nontender with normal range of motion in all extremities. Neurologic:  Normal speech and language. No gross focal neurologic deficits Skin:  Skin is warm, dry and intact.  Psychiatric: Mood and affect are normal.   ____________________________________________    EKG  EKG reviewed and interpreted vomit itself shows normal sinus rhythm at 89 bpm, shortened PR interval consistent with first-degree AV block, otherwise normal intervals, nonspecific but no concerning ST changes.  ____________________________________________    RADIOLOGY  Ultrasound negative besides fatty liver.  ____________________________________________   INITIAL IMPRESSION / ASSESSMENT AND PLAN / ED COURSE  Pertinent labs & imaging results that were available during my care of the patient were reviewed by me and considered in my medical decision making (see chart for details).  Patient percent emergency department with epigastric/right upper quadrant pain intermittently over the past one month. Patient states pain was worse today after eating. Patient came to the emergency department for evaluation. Patient's labs are largely within normal limits, normal white blood cell count. Normal LFTs. Lipase. Given the patient's postprandial pain we'll proceed with right upper quadrant ultrasound to further  evaluate. Patient agreeable plan. Suspect biliary colic versus gastritis.  Ultrasound largely negative. Possible mild CBD dilation although the patient has normal LFTs and lipase. It is positive for fatty liver disease which could be contributing to the patient's discomfort. I discussed with patient placing her on Protonix using liquid Maalox as needed. I'll prescribe a very short course of Norco for the patient. I will have her follow up with GI medicine. Patient states she'll call for the next available appointment.  ____________________________________________   FINAL CLINICAL IMPRESSION(S) / ED DIAGNOSES  Epigastric pain Right upper quadrant pain    Minna AntisKevin Henny Strauch, MD 12/07/15 2035

## 2016-09-09 ENCOUNTER — Ambulatory Visit: Payer: Self-pay | Attending: Oncology

## 2017-01-17 ENCOUNTER — Emergency Department (HOSPITAL_COMMUNITY)
Admission: EM | Admit: 2017-01-17 | Discharge: 2017-01-17 | Disposition: A | Discharge status: Home / Self Care | Payer: Self-pay | Attending: Emergency Medicine | Admitting: Emergency Medicine

## 2017-01-17 ENCOUNTER — Emergency Department (HOSPITAL_COMMUNITY): Payer: Self-pay

## 2017-01-17 ENCOUNTER — Other Ambulatory Visit: Payer: Self-pay

## 2017-01-17 ENCOUNTER — Encounter (HOSPITAL_COMMUNITY): Payer: Self-pay | Admitting: Emergency Medicine

## 2017-01-17 DIAGNOSIS — I10 Essential (primary) hypertension: Secondary | ICD-10-CM | POA: Insufficient documentation

## 2017-01-17 DIAGNOSIS — F17210 Nicotine dependence, cigarettes, uncomplicated: Secondary | ICD-10-CM | POA: Insufficient documentation

## 2017-01-17 DIAGNOSIS — G43901 Migraine, unspecified, not intractable, with status migrainosus: Secondary | ICD-10-CM | POA: Insufficient documentation

## 2017-01-17 DIAGNOSIS — Z7982 Long term (current) use of aspirin: Secondary | ICD-10-CM | POA: Insufficient documentation

## 2017-01-17 DIAGNOSIS — Z79899 Other long term (current) drug therapy: Secondary | ICD-10-CM | POA: Insufficient documentation

## 2017-01-17 LAB — CBC WITH DIFFERENTIAL/PLATELET
BASOS ABS: 0 10*3/uL (ref 0.0–0.1)
BASOS PCT: 0 %
EOS ABS: 0.2 10*3/uL (ref 0.0–0.7)
EOS PCT: 3 %
HCT: 37 % (ref 36.0–46.0)
Hemoglobin: 12.6 g/dL (ref 12.0–15.0)
Lymphocytes Relative: 34 %
Lymphs Abs: 2.7 10*3/uL (ref 0.7–4.0)
MCH: 30.6 pg (ref 26.0–34.0)
MCHC: 34.1 g/dL (ref 30.0–36.0)
MCV: 89.8 fL (ref 78.0–100.0)
Monocytes Absolute: 0.4 10*3/uL (ref 0.1–1.0)
Monocytes Relative: 6 %
Neutro Abs: 4.5 10*3/uL (ref 1.7–7.7)
Neutrophils Relative %: 57 %
PLATELETS: 280 10*3/uL (ref 150–400)
RBC: 4.12 MIL/uL (ref 3.87–5.11)
RDW: 13.7 % (ref 11.5–15.5)
WBC: 7.8 10*3/uL (ref 4.0–10.5)

## 2017-01-17 LAB — COMPREHENSIVE METABOLIC PANEL
ALT: 32 U/L (ref 14–54)
AST: 36 U/L (ref 15–41)
Albumin: 3.6 g/dL (ref 3.5–5.0)
Alkaline Phosphatase: 56 U/L (ref 38–126)
Anion gap: 9 (ref 5–15)
BUN: 13 mg/dL (ref 6–20)
CHLORIDE: 109 mmol/L (ref 101–111)
CO2: 23 mmol/L (ref 22–32)
CREATININE: 1.03 mg/dL — AB (ref 0.44–1.00)
Calcium: 8.9 mg/dL (ref 8.9–10.3)
GFR calc Af Amer: >60 mL/min (ref >60)
GFR calc non Af Amer: >60 mL/min (ref >60)
Glucose, Bld: 116 mg/dL — ABNORMAL HIGH (ref 65–99)
Potassium: 3 mmol/L — ABNORMAL LOW (ref 3.5–5.1)
SODIUM: 141 mmol/L (ref 135–145)
Total Bilirubin: 0.5 mg/dL (ref 0.3–1.2)
Total Protein: 7.1 g/dL (ref 6.5–8.1)

## 2017-01-17 MED ORDER — METOCLOPRAMIDE HCL 5 MG/ML IJ SOLN
10.0000 mg | Freq: Once | INTRAMUSCULAR | Status: AC
  Administered 2017-01-17: 10 mg via INTRAVENOUS
  Filled 2017-01-17: qty 2

## 2017-01-17 MED ORDER — LORAZEPAM 2 MG/ML IJ SOLN
1.0000 mg | Freq: Once | INTRAMUSCULAR | Status: AC
  Administered 2017-01-17: 1 mg via INTRAVENOUS
  Filled 2017-01-17: qty 1

## 2017-01-17 MED ORDER — DIPHENHYDRAMINE HCL 50 MG/ML IJ SOLN
25.0000 mg | Freq: Once | INTRAMUSCULAR | Status: AC
  Administered 2017-01-17: 25 mg via INTRAVENOUS
  Filled 2017-01-17: qty 1

## 2017-01-17 MED ORDER — SODIUM CHLORIDE 0.9 % IV SOLN
INTRAVENOUS | Status: DC
  Administered 2017-01-17: 21:00:00 via INTRAVENOUS

## 2017-01-17 MED ORDER — DEXAMETHASONE SODIUM PHOSPHATE 10 MG/ML IJ SOLN
10.0000 mg | Freq: Once | INTRAMUSCULAR | Status: AC
  Administered 2017-01-17: 10 mg via INTRAVENOUS
  Filled 2017-01-17: qty 1

## 2017-01-17 MED ORDER — SODIUM CHLORIDE 0.9 % IV BOLUS (SEPSIS)
1000.0000 mL | Freq: Once | INTRAVENOUS | Status: AC
  Administered 2017-01-17: 1000 mL via INTRAVENOUS

## 2017-01-17 NOTE — ED Triage Notes (Signed)
Per EMS pt ongoing left sided headache worsening over past 3 days.

## 2017-01-17 NOTE — ED Notes (Signed)
Patient transported to MRI 

## 2017-01-17 NOTE — ED Notes (Signed)
Pt ambulatory and independent at discharge.  Verbalized understanding of discharge instructions 

## 2017-01-17 NOTE — ED Provider Notes (Signed)
[facility]-EMERGENCY DEPT Provider Note   CSN: [id number] Arrival date & time: 01/17/17  1245     History   Chief Complaint Chief Complaint  Patient presents with  . Headache    HPI Bonnie Kelly is a 41 y.o. female.  Patient with a long-standing history of headaches.  But of late patient has had a left top of her head headache that is been present for a week.  Patient is been having increased headaches for the past 6 months.  Followed by primary care provider.  Doctors in the past of felt that they were migraine-like headaches.  Associated with nausea photophobia.  Denies any fevers.  Denies stiff neck.      Past Medical History:  Diagnosis Date  . Anemia   . Anxiety   . Closed fracture of right tibia and fibula 07/28/2011  . Hypertension   . Painful orthopaedic hardware (HCC) 11/19/2012  . Wears glasses     Patient Active Problem List   Diagnosis Date Noted  . Prediabetes 07/22/2014  . Tobacco use 07/22/2014  . Obesity 07/22/2014  . Pyelonephritis 07/21/2014  . Painful orthopaedic hardware (HCC) 11/19/2012  . Closed fracture of right tibia and fibula 07/28/2011    Past Surgical History:  Procedure Laterality Date  . Birth Control Implant in L arm Left 11/2011   implanon  . DILATION AND CURETTAGE OF UTERUS  1997  . FRACTURE SURGERY    . HARDWARE REMOVAL Right 11/19/2012   Procedure: HARDWARE REMOVAL RIGHT TIBIAL NAIL AND SCREWS;  Surgeon: Eulas Post, MD;  Location: [facility];  Service: Orthopedics;  Laterality: Right;  . TIBIA IM NAIL INSERTION  07/28/2011   Procedure: INTRAMEDULLARY (IM) NAIL TIBIAL;  Surgeon: Eulas Post, MD;  Location: [facility] OR;  Service: Orthopedics;  Laterality: Right;    OB History    No data available       Home Medications    Prior to Admission medications   Medication Sig Start Date End Date Taking? Authorizing Provider  acetaminophen (TYLENOL) 500 MG tablet Take 1,000 mg by mouth  every 6 (six) hours as needed for moderate pain.   Yes [provider]  amitriptyline (ELAVIL) 100 MG tablet TAKE 1 TABLET BY MOUTH EVERYDAY AT BEDTIME 12/27/16  Yes [provider]  amLODipine (NORVASC) 10 MG tablet Take 10 mg by mouth daily.   Yes [provider]  aspirin-acetaminophen-caffeine (EXCEDRIN MIGRAINE) [phone] MG tablet Take 2 tablets by mouth every 6 (six) hours as needed for headache or migraine.   Yes [provider]  atorvastatin (LIPITOR) 20 MG tablet Take 20 mg by mouth every evening.  11/21/16  Yes [provider]  clonazePAM (KLONOPIN) 1 MG tablet Take 1 mg by mouth 2 (two) times daily.   Yes [provider]  etonogestrel (IMPLANON) 68 MG IMPL implant Inject 1 each into the skin once.   Yes [provider]  ondansetron (ZOFRAN) 4 MG tablet TAKE 1 TABLET BY MOUTH EVERY 6-8 HOURS AS NEEDED FOR NAUSEA OR VOMITING. MUST LAST 30 DAYS 11/28/16  Yes [provider]  pantoprazole (PROTONIX) 40 MG tablet Take 40 mg by mouth daily. 01/15/17  Yes [provider]  verapamil (CALAN) 40 MG tablet Take 40 mg by mouth 2 (two) times daily. 01/14/17  Yes [provider]  eszopiclone (LUNESTA) 2 MG TABS tablet Take 2 mg by mouth at bedtime. Take immediately before bedtime    [provider]  HYDROcodone-acetaminophen (  NORCO/VICODIN) 5-325 MG tablet Take 1 tablet by mouth every 4 (four) hours as needed. Patient not taking: Reported on 01/17/2017 12/07/15   Minna AntisPaduchowski, Kevin, MD  oxyCODONE-acetaminophen (PERCOCET) 5-325 MG tablet Take 1 tablet by mouth every 4 (four) hours as needed for moderate pain. Patient not taking: Reported on 01/17/2017 11/05/15   Dione BoozeGlick, David, MD  pantoprazole (PROTONIX) 40 MG tablet Take 1 tablet (40 mg total) by mouth daily. 12/07/15 12/06/16  Minna AntisPaduchowski, Kevin, MD    Family History Family History  Problem Relation Age of Onset  . Diabetes Mother   . Hypertension Mother    . Heart failure Mother     Social History Social History   Tobacco Use  . Smoking status: Current Every Day Smoker    Packs/day: 1.00    Years: 27.00    Pack years: 27.00    Types: Cigarettes  . Smokeless tobacco: Never Used  Substance Use Topics  . Alcohol use: Yes    Comment: 07/21/2014 "I'll have a couple drinks q 6 months"  . Drug use: No     Allergies   Patient has no known allergies.   Review of Systems Review of Systems  Constitutional: Negative for fever.  HENT: Negative for congestion.   Eyes: Positive for photophobia.  Respiratory: Negative for shortness of breath.   Cardiovascular: Negative for chest pain.  Gastrointestinal: Positive for nausea. Negative for abdominal pain and vomiting.  Genitourinary: Negative for dysuria.  Musculoskeletal: Negative for neck stiffness.  Skin: Negative for rash.  Neurological: Positive for headaches.  Hematological: Does not bruise/bleed easily.  Psychiatric/Behavioral: Negative for confusion.     Physical Exam Updated Vital Signs BP (!) 129/95 (BP Location: Left Arm)   Pulse 88   Temp 98.4 F (36.9 C) (Oral)   Resp 17   SpO2 100%   Physical Exam  Constitutional: She is oriented to person, place, and time. She appears well-developed and well-nourished. No distress.  HENT:  Head: Normocephalic and atraumatic.  Mouth/Throat: Oropharynx is clear and moist.  Eyes: Conjunctivae and EOM are normal. Pupils are equal, round, and reactive to light.  Neck: Normal range of motion. Neck supple.  Cardiovascular: Normal rate, regular rhythm and normal heart sounds.  Pulmonary/Chest: Effort normal and breath sounds normal. No respiratory distress.  Abdominal: Soft. Bowel sounds are normal.  Musculoskeletal: Normal range of motion.  Neurological: She is alert and oriented to person, place, and time. No cranial nerve deficit or sensory deficit. She exhibits normal muscle tone. Coordination normal.  Skin: Skin is warm. No rash  noted.  Nursing note and vitals reviewed.    ED Treatments / Results  Labs (all labs ordered are listed, but only abnormal results are displayed) Labs Reviewed  COMPREHENSIVE METABOLIC PANEL - Abnormal; Notable for the following components:      Result Value   Potassium 3.0 (*)    Glucose, Bld 116 (*)    Creatinine, Ser 1.03 (*)    All other components within normal limits  CBC WITH DIFFERENTIAL/PLATELET    EKG  EKG Interpretation None       Radiology Mr Brain Wo Contrast (neuro Protocol)  Result Date: 01/17/2017 CLINICAL DATA:  Initial evaluation for acute right-sided headache. EXAM: MRI HEAD WITHOUT CONTRAST TECHNIQUE: Multiplanar, multiecho pulse sequences of the brain and surrounding structures were obtained without intravenous contrast. COMPARISON:  Prior CT from 02/03/2011. FINDINGS: Brain: Cerebral volume within normal limits for patient age. No focal parenchymal signal abnormality identified. No abnormal foci of restricted diffusion  to suggest acute or subacute ischemia. Gray-white matter differentiation well maintained. No encephalomalacia to suggest chronic infarction. No foci of susceptibility artifact to suggest acute or chronic intracranial hemorrhage. No mass lesion, midline shift or mass effect. No hydrocephalus. No extra-axial fluid collection. Major dural sinuses are grossly patent. Pituitary gland and suprasellar region are normal. Midline structures intact and normal. Vascular: Major intracranial vascular flow voids well maintained and normal in appearance. Skull and upper cervical spine: Craniocervical junction normal. Visualized upper cervical spine within normal limits. Bone marrow signal intensity normal. No scalp soft tissue abnormality. Sinuses/Orbits: Globes and orbital soft tissues within normal limits. Paranasal sinuses are clear. No mastoid effusion. Inner ear structures normal. Other: None. IMPRESSION: Normal brain MRI.  No acute intracranial abnormality  identified. Electronically Signed   By: Rise MuBenjamin  McClintock M.D.   On: 01/17/2017 20:16    Procedures Procedures (including critical care time)  Medications Ordered in ED Medications  0.9 %  sodium chloride infusion ( Intravenous New Bag/Given 01/17/17 2037)  sodium chloride 0.9 % bolus 1,000 mL (0 mLs Intravenous Stopped 01/17/17 2036)  dexamethasone (DECADRON) injection 10 mg (10 mg Intravenous Given 01/17/17 1847)  diphenhydrAMINE (BENADRYL) injection 25 mg (25 mg Intravenous Given 01/17/17 1849)  metoCLOPramide (REGLAN) injection 10 mg (10 mg Intravenous Given 01/17/17 1848)  LORazepam (ATIVAN) injection 1 mg (1 mg Intravenous Given 01/17/17 1845)     Initial Impression / Assessment and Plan / ED Course  I have reviewed the triage vital signs and the nursing notes.  Pertinent labs & imaging results that were available during my care of the patient were reviewed by me and considered in my medical decision making (see chart for details).    Patient's workup for the chronic headache without any acute findings on MRI.  Patient with significant improvement with migraine cocktail.  Patient received Reglan and Benadryl and Decadron feeling much better.  Patient given referral to follow-up with neurology as well as back with her primary care provider.  MRI rules out any evidence of stroke or tumor.  Headaches seem to be consistent with migraine pattern.  Final Clinical Impressions(s) / ED Diagnoses   Final diagnoses:  Migraine with status migrainosus, not intractable, unspecified migraine type    ED Discharge Orders    None       Vanetta MuldersZackowski, Mareta Chesnut, MD 01/17/17 [id number]

## 2017-01-17 NOTE — Discharge Instructions (Signed)
MRI tonight was negative.  Symptoms probably consistent with a typical migraine.  Follow-up with your primary care doctor and referral information provided to follow-up with neurology.

## 2019-09-22 IMAGING — MR MR HEAD W/O CM
10 series · 41 of 48 positions shown · non-contrast
Comparison: Prior CT from 02/03/2011.

CLINICAL DATA: Initial evaluation for acute right-sided headache.

EXAM:
MRI HEAD WITHOUT CONTRAST
TECHNIQUE: Multiplanar, multiecho pulse sequences of the brain and surrounding
structures were obtained without intravenous contrast.

[Series 3: T1 · sagittal · 5.0mm · 0.47mm/px · 2 of 23 slices shown]
[im 1/23]
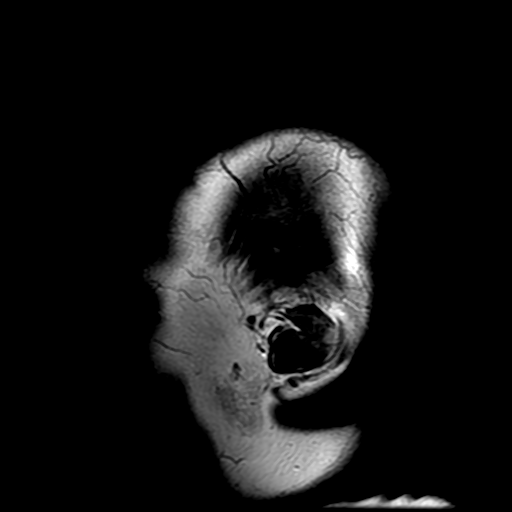
[im 23/23]
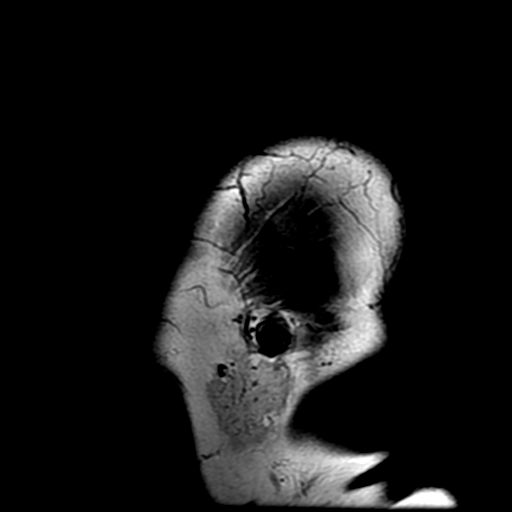

[Series 4: DWI · axial · 3.0mm · 1.09mm/px · z∈[-36,+110]mm · 8 of 100 slices shown (1 of 4)]
[im 1/100]
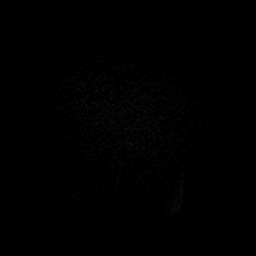
[im 12/100]
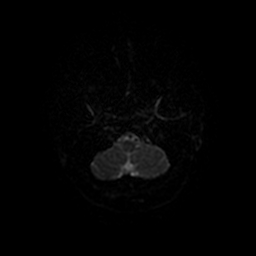
[im 34/100]
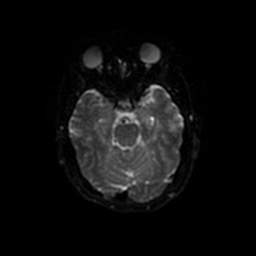
[im 45/100]
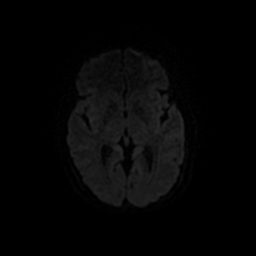
[im 56/100]
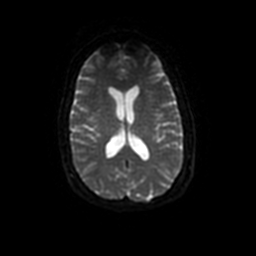
[im 67/100]
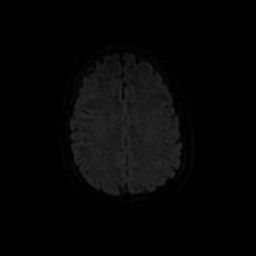
[im 89/100]
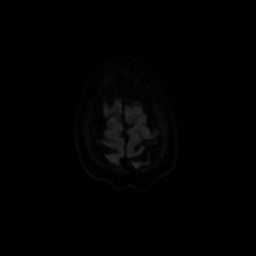
[im 100/100]
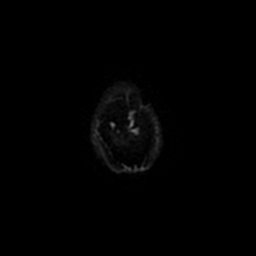

[Series 5: DWI · coronal · 5.0mm · 1.09mm/px · 8 of 74 slices shown (2 of 4)]
[im 1/74]
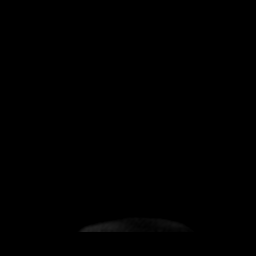
[im 11/74]
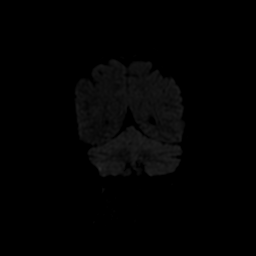
[im 21/74]
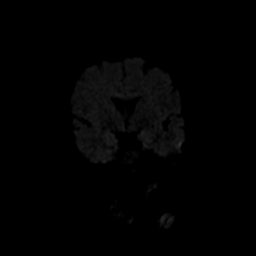
[im 32/74]
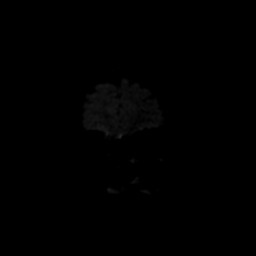
[im 42/74]
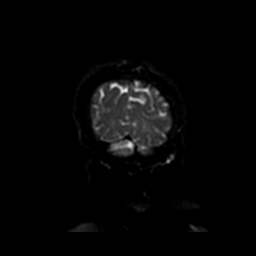
[im 53/74]
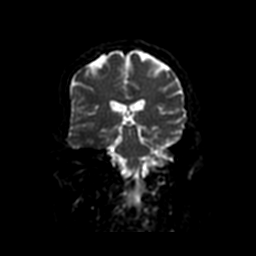
[im 63/74]
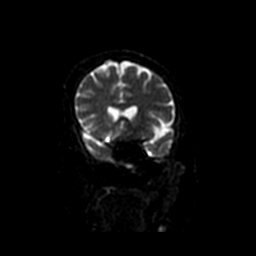
[im 74/74]
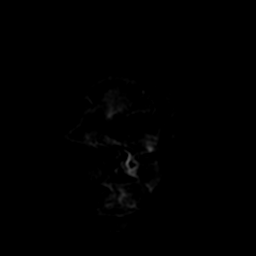

[Series 6: T2 · axial · 5.0mm · 0.86mm/px · z∈[-37,+117]mm · 3 of 23 slices shown (1 of 2)]
[im 1/23]
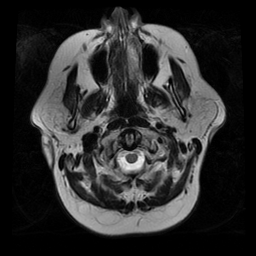
[im 12/23]
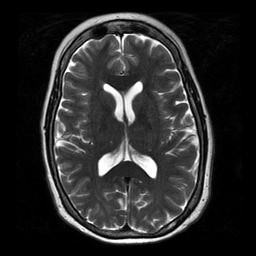
[im 23/23]
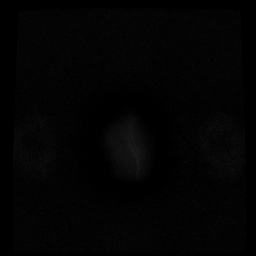

[Series 7: FLAIR · axial · 5.0mm · 0.43mm/px · z∈[-37,+117]mm · 3 of 23 slices shown]
[im 1/23]
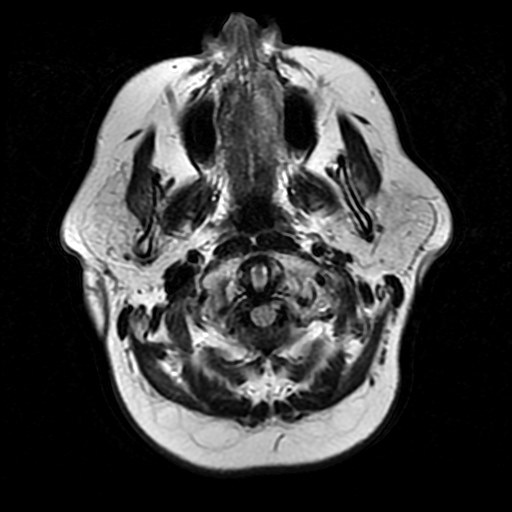
[im 12/23]
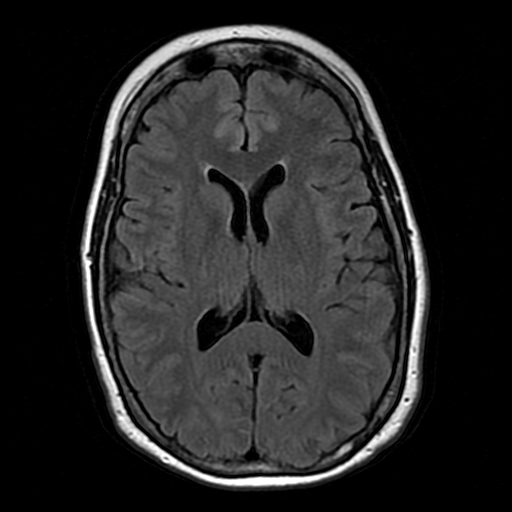
[im 23/23]
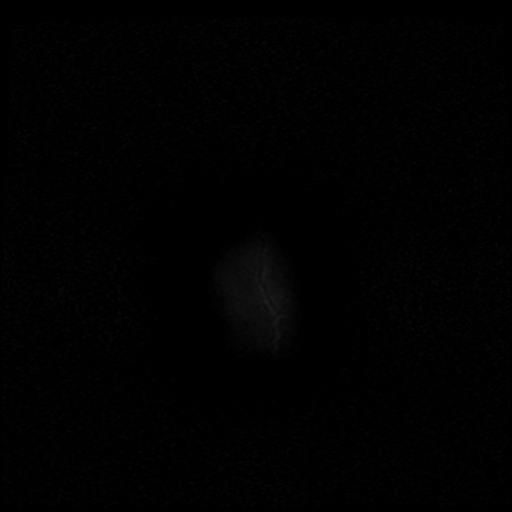

[Series 8: ax mpgr · axial · 5.0mm · 0.43mm/px · z∈[-37,+117]mm · 3 of 23 slices shown]
[im 1/23]
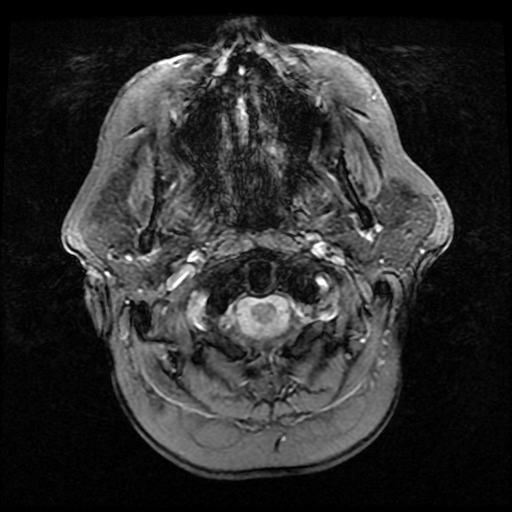
[im 12/23]
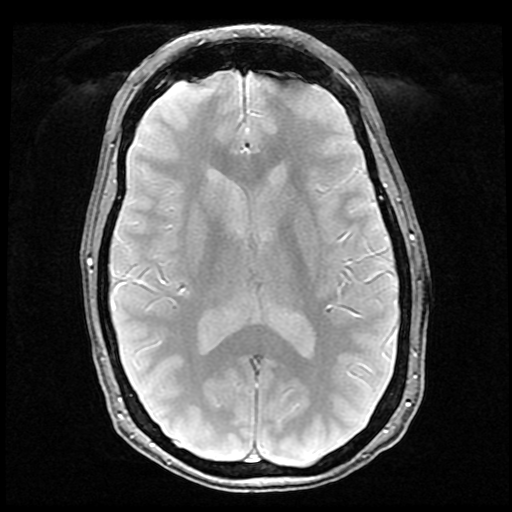
[im 23/23]
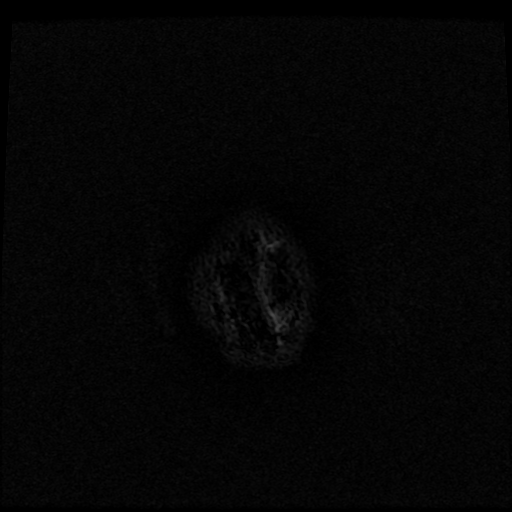

[Series 9: ax fspgr irp · axial · 3.0mm · 0.47mm/px · 1 of 50 slices shown]
[im 1/50]
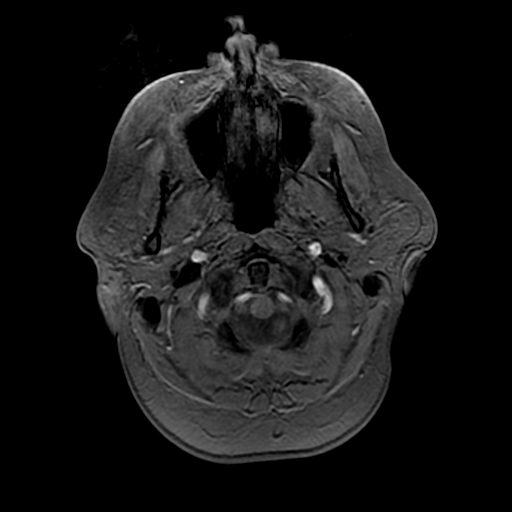

[Series 10: T2 · coronal · 5.0mm · 0.90mm/px · 3 of 28 slices shown (2 of 2)]
[im 1/28]
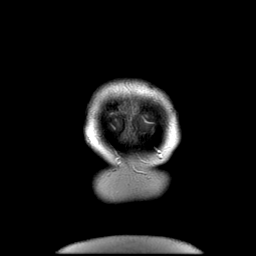
[im 14/28]
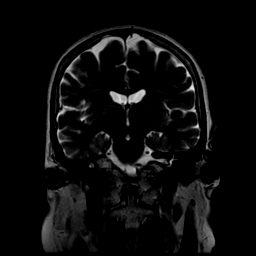
[im 28/28]
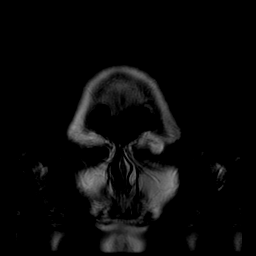

[Series 400: DWI · axial · 3.0mm · 1.09mm/px · z∈[-36,+110]mm · 6 of 50 slices shown (3 of 4)]
[im 1/50]
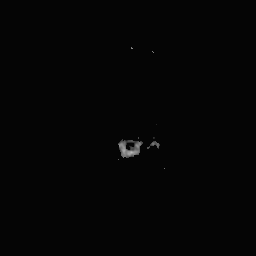
[im 10/50]
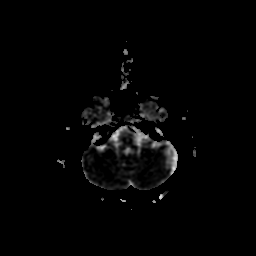
[im 20/50]
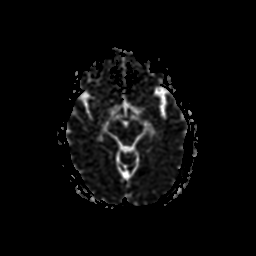
[im 30/50]
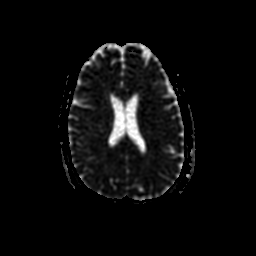
[im 40/50]
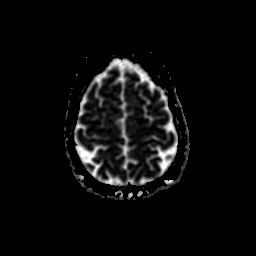
[im 50/50]
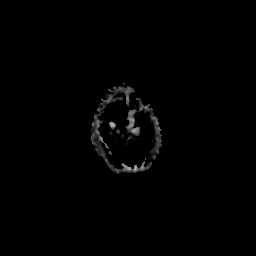

[Series 500: DWI · coronal · 5.0mm · 1.09mm/px · 4 of 37 slices shown (4 of 4)]
[im 1/37]
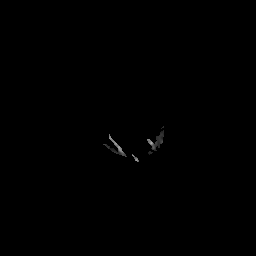
[im 13/37]
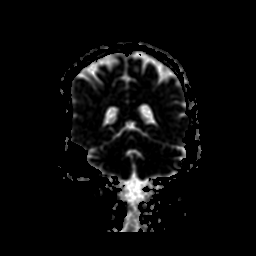
[im 25/37]
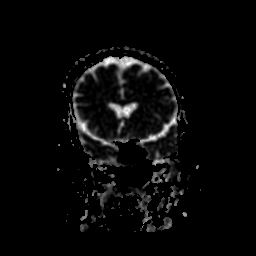
[im 37/37]
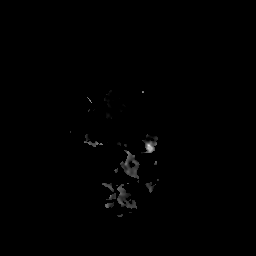

[41 of 48 positions shown; findings below may reference images not displayed]

FINDINGS: Brain: Cerebral volume within normal limits for patient age. No
focal parenchymal signal abnormality identified. No abnormal foci of
restricted diffusion to suggest acute or subacute ischemia.
Gray-white matter differentiation well maintained. No
encephalomalacia to suggest chronic infarction. No foci of
susceptibility artifact to suggest acute or chronic intracranial
hemorrhage.

No mass lesion, midline shift or mass effect. No hydrocephalus. No
extra-axial fluid collection. Major dural sinuses are grossly
patent.

Pituitary gland and suprasellar region are normal. Midline
structures intact and normal.

Vascular: Major intracranial vascular flow voids well maintained and
normal in appearance.

Skull and upper cervical spine: Craniocervical junction normal.
Visualized upper cervical spine within normal limits. Bone marrow
signal intensity normal. No scalp soft tissue abnormality.

Sinuses/Orbits: Globes and orbital soft tissues within normal
limits. Paranasal sinuses are clear. No mastoid effusion. Inner ear
structures normal.

Other: None.
IMPRESSION: Normal brain MRI.  No acute intracranial abnormality identified.
